# Patient Record
Sex: Female | Born: 1985 | Race: White | Hispanic: No | Smoking: Former smoker
Health system: Southern US, Community
[De-identification: ages and names within clinical notes are randomized; demographics above are authoritative.]

## PROBLEM LIST (undated history)

## (undated) DIAGNOSIS — R87629 Unspecified abnormal cytological findings in specimens from vagina: Secondary | ICD-10-CM

## (undated) DIAGNOSIS — S83006A Unspecified dislocation of unspecified patella, initial encounter: Secondary | ICD-10-CM

## (undated) DIAGNOSIS — F32A Depression, unspecified: Secondary | ICD-10-CM

## (undated) DIAGNOSIS — IMO0002 Reserved for concepts with insufficient information to code with codable children: Secondary | ICD-10-CM

## (undated) DIAGNOSIS — L0501 Pilonidal cyst with abscess: Secondary | ICD-10-CM

## (undated) DIAGNOSIS — R519 Headache, unspecified: Secondary | ICD-10-CM

## (undated) DIAGNOSIS — E162 Hypoglycemia, unspecified: Secondary | ICD-10-CM

## (undated) DIAGNOSIS — R87619 Unspecified abnormal cytological findings in specimens from cervix uteri: Secondary | ICD-10-CM

## (undated) DIAGNOSIS — A6000 Herpesviral infection of urogenital system, unspecified: Secondary | ICD-10-CM

## (undated) DIAGNOSIS — L709 Acne, unspecified: Secondary | ICD-10-CM

## (undated) DIAGNOSIS — S060X9A Concussion with loss of consciousness of unspecified duration, initial encounter: Secondary | ICD-10-CM

## (undated) DIAGNOSIS — A749 Chlamydial infection, unspecified: Secondary | ICD-10-CM

## (undated) DIAGNOSIS — F419 Anxiety disorder, unspecified: Secondary | ICD-10-CM

## (undated) DIAGNOSIS — Z8619 Personal history of other infectious and parasitic diseases: Secondary | ICD-10-CM

## (undated) HISTORY — DX: Chlamydial infection, unspecified: A74.9

## (undated) HISTORY — DX: Herpesviral infection of urogenital system, unspecified: A60.00

## (undated) HISTORY — DX: Hypoglycemia, unspecified: E16.2

## (undated) HISTORY — DX: Unspecified abnormal cytological findings in specimens from cervix uteri: R87.619

## (undated) HISTORY — DX: Reserved for concepts with insufficient information to code with codable children: IMO0002

## (undated) HISTORY — PX: MYRINGOTOMY WITH TUBE PLACEMENT: SHX5663

## (undated) HISTORY — DX: Concussion with loss of consciousness of unspecified duration, initial encounter: S06.0X9A

## (undated) HISTORY — DX: Unspecified dislocation of unspecified patella, initial encounter: S83.006A

## (undated) HISTORY — DX: Personal history of other infectious and parasitic diseases: Z86.19

## (undated) HISTORY — PX: TONSILLECTOMY AND ADENOIDECTOMY: SHX28

## (undated) HISTORY — DX: Acne, unspecified: L70.9

## (undated) HISTORY — DX: Pilonidal cyst with abscess: L05.01

---
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
## 2007-02-01 ENCOUNTER — Encounter: Payer: Self-pay | Admitting: Internal Medicine

## 2007-02-01 DIAGNOSIS — R42 Dizziness and giddiness: Secondary | ICD-10-CM

## 2007-02-17 ENCOUNTER — Ambulatory Visit: Payer: Self-pay | Admitting: Internal Medicine

## 2007-09-27 ENCOUNTER — Ambulatory Visit: Payer: Self-pay | Admitting: Internal Medicine

## 2007-09-27 DIAGNOSIS — N63 Unspecified lump in unspecified breast: Secondary | ICD-10-CM

## 2007-09-27 DIAGNOSIS — L708 Other acne: Secondary | ICD-10-CM | POA: Insufficient documentation

## 2007-10-09 ENCOUNTER — Telehealth: Payer: Self-pay | Admitting: Internal Medicine

## 2007-10-11 ENCOUNTER — Encounter: Payer: Self-pay | Admitting: Internal Medicine

## 2007-11-13 ENCOUNTER — Emergency Department (HOSPITAL_COMMUNITY): Admission: EM | Admit: 2007-11-13 | Discharge: 2007-11-13 | Payer: Self-pay | Admitting: Emergency Medicine

## 2007-11-22 ENCOUNTER — Ambulatory Visit: Payer: Self-pay | Admitting: Internal Medicine

## 2007-11-22 DIAGNOSIS — S99929A Unspecified injury of unspecified foot, initial encounter: Secondary | ICD-10-CM

## 2007-11-22 DIAGNOSIS — S060XAA Concussion with loss of consciousness status unknown, initial encounter: Secondary | ICD-10-CM | POA: Insufficient documentation

## 2007-11-22 DIAGNOSIS — S060X9A Concussion with loss of consciousness of unspecified duration, initial encounter: Secondary | ICD-10-CM

## 2007-11-22 DIAGNOSIS — S8990XA Unspecified injury of unspecified lower leg, initial encounter: Secondary | ICD-10-CM

## 2007-11-22 DIAGNOSIS — S99919A Unspecified injury of unspecified ankle, initial encounter: Secondary | ICD-10-CM

## 2007-11-22 HISTORY — DX: Concussion with loss of consciousness status unknown, initial encounter: S06.0XAA

## 2007-11-22 HISTORY — DX: Concussion with loss of consciousness of unspecified duration, initial encounter: S06.0X9A

## 2007-11-28 ENCOUNTER — Encounter: Payer: Self-pay | Admitting: Internal Medicine

## 2008-01-03 ENCOUNTER — Encounter: Payer: Self-pay | Admitting: Internal Medicine

## 2008-06-19 ENCOUNTER — Ambulatory Visit: Payer: Self-pay | Admitting: Internal Medicine

## 2008-06-19 DIAGNOSIS — F4321 Adjustment disorder with depressed mood: Secondary | ICD-10-CM

## 2008-06-19 DIAGNOSIS — R5381 Other malaise: Secondary | ICD-10-CM

## 2008-06-19 DIAGNOSIS — R5383 Other fatigue: Secondary | ICD-10-CM

## 2008-06-19 LAB — CONVERTED CEMR LAB
Bilirubin Urine: NEGATIVE
Glucose, Urine, Semiquant: NEGATIVE
Heterophile Ab Screen: NEGATIVE
Ketones, urine, test strip: NEGATIVE
Nitrite: NEGATIVE
Protein, U semiquant: NEGATIVE
Specific Gravity, Urine: 1.025
Urobilinogen, UA: 0.2
WBC Urine, dipstick: NEGATIVE
pH: 5.5

## 2008-07-15 ENCOUNTER — Encounter: Payer: Self-pay | Admitting: Internal Medicine

## 2008-07-16 ENCOUNTER — Encounter: Payer: Self-pay | Admitting: Internal Medicine

## 2008-07-16 LAB — CONVERTED CEMR LAB
ALT: 15 units/L (ref 0–35)
AST: 22 units/L (ref 0–37)
Albumin: 4.4 g/dL (ref 3.5–5.2)
Alkaline Phosphatase: 70 units/L (ref 39–117)
BUN: 14 mg/dL (ref 6–23)
Basophils Absolute: 0 10*3/uL (ref 0.0–0.1)
Basophils Relative: 0.5 % (ref 0.0–3.0)
Bilirubin, Direct: 0.1 mg/dL (ref 0.0–0.3)
CO2: 28 meq/L (ref 19–32)
Calcium: 10.1 mg/dL (ref 8.4–10.5)
Chloride: 106 meq/L (ref 96–112)
Cholesterol: 195 mg/dL (ref 0–200)
Creatinine, Ser: 0.7 mg/dL (ref 0.4–1.2)
Eosinophils Absolute: 0.2 10*3/uL (ref 0.0–0.7)
Eosinophils Relative: 4 % (ref 0.0–5.0)
Free T4: 0.9 ng/dL (ref 0.6–1.6)
GFR calc Af Amer: 137 mL/min
GFR calc non Af Amer: 113 mL/min
Glucose, Bld: 76 mg/dL (ref 70–99)
HCT: 45.3 % (ref 36.0–46.0)
HDL: 50.7 mg/dL (ref >39.0)
Hemoglobin: 15.7 g/dL — ABNORMAL HIGH (ref 12.0–15.0)
LDL Cholesterol: 124 mg/dL — ABNORMAL HIGH (ref 0–99)
Lymphocytes Relative: 26.4 % (ref 12.0–46.0)
MCHC: 34.5 g/dL (ref 30.0–36.0)
MCV: 95.3 fL (ref 78.0–100.0)
Monocytes Absolute: 0.3 10*3/uL (ref 0.1–1.0)
Monocytes Relative: 5.8 % (ref 3.0–12.0)
Neutro Abs: 3.5 10*3/uL (ref 1.4–7.7)
Neutrophils Relative %: 63.3 % (ref 43.0–77.0)
Platelets: 314 10*3/uL (ref 150–400)
Potassium: 4.1 meq/L (ref 3.5–5.1)
RBC: 4.76 M/uL (ref 3.87–5.11)
RDW: 12 % (ref 11.5–14.6)
Sodium: 141 meq/L (ref 135–145)
T3, Free: 3.3 pg/mL (ref 2.3–4.2)
TSH: 1.92 microintl units/mL (ref 0.35–5.50)
Total Bilirubin: 0.6 mg/dL (ref 0.3–1.2)
Total CHOL/HDL Ratio: 3.8
Total Protein: 7.8 g/dL (ref 6.0–8.3)
Triglycerides: 103 mg/dL (ref 0–149)
VLDL: 21 mg/dL (ref 0–40)
WBC: 5.5 10*3/uL (ref 4.5–10.5)

## 2008-07-23 ENCOUNTER — Ambulatory Visit: Payer: Self-pay | Admitting: Internal Medicine

## 2008-07-23 DIAGNOSIS — R82998 Other abnormal findings in urine: Secondary | ICD-10-CM

## 2008-07-23 LAB — CONVERTED CEMR LAB
Bilirubin Urine: NEGATIVE
Blood in Urine, dipstick: NEGATIVE
Glucose, Urine, Semiquant: NEGATIVE
Ketones, urine, test strip: NEGATIVE
Nitrite: NEGATIVE
Protein, U semiquant: NEGATIVE
Specific Gravity, Urine: 1.01
Urobilinogen, UA: 0.2
WBC Urine, dipstick: NEGATIVE
pH: 5

## 2008-12-18 ENCOUNTER — Other Ambulatory Visit: Admission: RE | Admit: 2008-12-18 | Discharge: 2008-12-18 | Payer: Self-pay | Admitting: Gynecology

## 2008-12-18 ENCOUNTER — Ambulatory Visit: Payer: Self-pay | Admitting: Gynecology

## 2008-12-18 ENCOUNTER — Encounter: Payer: Self-pay | Admitting: Gynecology

## 2009-01-22 ENCOUNTER — Ambulatory Visit: Payer: Self-pay | Admitting: Internal Medicine

## 2009-01-22 DIAGNOSIS — M25569 Pain in unspecified knee: Secondary | ICD-10-CM | POA: Insufficient documentation

## 2009-01-22 DIAGNOSIS — S83006A Unspecified dislocation of unspecified patella, initial encounter: Secondary | ICD-10-CM

## 2009-01-22 HISTORY — DX: Unspecified dislocation of unspecified patella, initial encounter: S83.006A

## 2009-07-29 ENCOUNTER — Ambulatory Visit: Payer: Self-pay | Admitting: Gynecology

## 2010-03-17 ENCOUNTER — Ambulatory Visit: Payer: Self-pay | Admitting: Internal Medicine

## 2010-03-17 DIAGNOSIS — R079 Chest pain, unspecified: Secondary | ICD-10-CM | POA: Insufficient documentation

## 2010-03-17 DIAGNOSIS — H6690 Otitis media, unspecified, unspecified ear: Secondary | ICD-10-CM | POA: Insufficient documentation

## 2010-03-17 DIAGNOSIS — J019 Acute sinusitis, unspecified: Secondary | ICD-10-CM | POA: Insufficient documentation

## 2010-03-26 DIAGNOSIS — IMO0002 Reserved for concepts with insufficient information to code with codable children: Secondary | ICD-10-CM

## 2010-03-26 HISTORY — DX: Reserved for concepts with insufficient information to code with codable children: IMO0002

## 2010-04-07 ENCOUNTER — Other Ambulatory Visit: Admission: RE | Admit: 2010-04-07 | Discharge: 2010-04-07 | Payer: Self-pay | Admitting: Gynecology

## 2010-04-07 ENCOUNTER — Ambulatory Visit: Payer: Self-pay | Admitting: Gynecology

## 2010-06-15 ENCOUNTER — Ambulatory Visit: Payer: Self-pay | Admitting: Gynecology
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
## 2010-09-09 ENCOUNTER — Ambulatory Visit (INDEPENDENT_AMBULATORY_CARE_PROVIDER_SITE_OTHER): Payer: BC Managed Care – PPO | Admitting: Gynecology

## 2010-09-09 DIAGNOSIS — N898 Other specified noninflammatory disorders of vagina: Secondary | ICD-10-CM

## 2010-09-09 DIAGNOSIS — B373 Candidiasis of vulva and vagina: Secondary | ICD-10-CM

## 2010-09-09 DIAGNOSIS — Z113 Encounter for screening for infections with a predominantly sexual mode of transmission: Secondary | ICD-10-CM

## 2010-10-27 ENCOUNTER — Encounter: Payer: Self-pay | Admitting: Internal Medicine

## 2010-10-27 ENCOUNTER — Ambulatory Visit (INDEPENDENT_AMBULATORY_CARE_PROVIDER_SITE_OTHER): Payer: BC Managed Care – PPO | Admitting: Internal Medicine

## 2010-10-27 VITALS — BP 100/60 | HR 72 | Temp 97.6°F | Wt 96.0 lb

## 2010-10-27 DIAGNOSIS — L0591 Pilonidal cyst without abscess: Secondary | ICD-10-CM

## 2010-10-27 MED ORDER — CEPHALEXIN 500 MG PO CAPS: 500.0000 mg | ORAL_CAPSULE | Freq: Three times a day (TID) | ORAL | Status: AC

## 2010-10-27 NOTE — Progress Notes (Signed)
  Subjective:    Patient ID: Melinda Bell, female    DOB: [birth year], 25 y.o.   MRN: [id number]  HPI Patient comes in today for an acute visit because she has a sore place at the base of her buttocks for 2 days. She has not had this problem before has had no injury it hurt very badly yesterday and is somewhat better today. There is no fever change in bowel habits or GU symptoms.   Review of Systems No fevers sweats history of skin infections.    Objective:   Physical Exam Well-developed well-nourished in no acute distress There is some redness at the base of the coccyx the pilonidal area but no cystic or mass area or fluctuance although the midline is somewhat red in and pooched out.  No central pore.  This is mildly tender.       Assessment & Plan:  Pilonidal cyst inflammation.  Though I don't feel a full abscess we discussed some I&D to see if we could express some pus patient was hesitant to do this.  Since she is better than she was yesterday we can try conservative therapy with warm compresses and antibiotic and followup in 24-48 hours if getting worse. She is leaving town for the yeast or holiday tomorrow sometime and she can call in the morning and we can work her in  For above if needed.

## 2010-10-27 NOTE — Patient Instructions (Signed)
This appears to be a pilonial cyst infection  It is very small but could be come large and need to be drained for comfort and to get better .  Warm compresses( showers) 5-10 minutes  frequently   Antibiotic  As directed  Can call in am and we can work you in  If needed if worse instead of better .
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
## 2011-03-27 DIAGNOSIS — A749 Chlamydial infection, unspecified: Secondary | ICD-10-CM

## 2011-03-27 HISTORY — DX: Chlamydial infection, unspecified: A74.9

## 2011-04-10 ENCOUNTER — Other Ambulatory Visit: Payer: Self-pay | Admitting: Gynecology

## 2011-04-12 ENCOUNTER — Other Ambulatory Visit: Payer: Self-pay | Admitting: *Deleted

## 2011-04-12 DIAGNOSIS — IMO0001 Reserved for inherently not codable concepts without codable children: Secondary | ICD-10-CM

## 2011-04-12 MED ORDER — NORGESTIMATE-ETH ESTRADIOL 0.25-35 MG-MCG PO TABS: 1.0000 | ORAL_TABLET | Freq: Every day | ORAL | Status: DC

## 2011-04-15 DIAGNOSIS — IMO0002 Reserved for concepts with insufficient information to code with codable children: Secondary | ICD-10-CM | POA: Insufficient documentation

## 2011-04-21 ENCOUNTER — Other Ambulatory Visit (HOSPITAL_COMMUNITY)
Admission: RE | Admit: 2011-04-21 | Discharge: 2011-04-21 | Disposition: A | Discharge status: Home / Self Care | Payer: BC Managed Care – PPO | Source: Ambulatory Visit | Attending: Gynecology | Admitting: Gynecology

## 2011-04-21 ENCOUNTER — Encounter: Payer: Self-pay | Admitting: Gynecology

## 2011-04-21 ENCOUNTER — Ambulatory Visit (INDEPENDENT_AMBULATORY_CARE_PROVIDER_SITE_OTHER): Payer: BC Managed Care – PPO | Admitting: Gynecology

## 2011-04-21 DIAGNOSIS — B373 Candidiasis of vulva and vagina: Secondary | ICD-10-CM

## 2011-04-21 DIAGNOSIS — R82998 Other abnormal findings in urine: Secondary | ICD-10-CM

## 2011-04-21 DIAGNOSIS — N870 Mild cervical dysplasia: Secondary | ICD-10-CM

## 2011-04-21 DIAGNOSIS — Z131 Encounter for screening for diabetes mellitus: Secondary | ICD-10-CM

## 2011-04-21 DIAGNOSIS — R3915 Urgency of urination: Secondary | ICD-10-CM

## 2011-04-21 DIAGNOSIS — N390 Urinary tract infection, site not specified: Secondary | ICD-10-CM

## 2011-04-21 DIAGNOSIS — Z3041 Encounter for surveillance of contraceptive pills: Secondary | ICD-10-CM

## 2011-04-21 DIAGNOSIS — Z13220 Encounter for screening for lipoid disorders: Secondary | ICD-10-CM

## 2011-04-21 DIAGNOSIS — N898 Other specified noninflammatory disorders of vagina: Secondary | ICD-10-CM

## 2011-04-21 DIAGNOSIS — Z01419 Encounter for gynecological examination (general) (routine) without abnormal findings: Secondary | ICD-10-CM | POA: Insufficient documentation

## 2011-04-21 DIAGNOSIS — Z113 Encounter for screening for infections with a predominantly sexual mode of transmission: Secondary | ICD-10-CM

## 2011-04-21 MED ORDER — TERCONAZOLE 0.8 % VA CREA: 1.0000 | TOPICAL_CREAM | Freq: Every day | VAGINAL | Status: AC

## 2011-04-21 MED ORDER — SULFAMETHOXAZOLE-TRIMETHOPRIM 800-160 MG PO TABS: 1.0000 | ORAL_TABLET | Freq: Two times a day (BID) | ORAL | Status: AC

## 2011-04-21 MED ORDER — NORGESTIMATE-ETH ESTRADIOL 0.25-35 MG-MCG PO TABS: 1.0000 | ORAL_TABLET | Freq: Every day | ORAL | Status: DC

## 2011-04-21 NOTE — Progress Notes (Signed)
Melinda Bell Jan 16, 1986 [id number]        25 y.o.  for annual exam.  Overall she's doing well but she does note the last several days an increased frequency and suprapubic pressure some mild dysuria. Also a vaginal discharge not really itchy but just heavier. She does have a history of recurrent yeast in the past. She has a history of low-grade SIL changes colposcopic biopsy confirmed low-grade changes a year ago she did not followup for her six-month Pap as recommended. She is on Sprintec doing well wants to continue.     Past medical history,surgical history, medications, allergies, family history and social history were all reviewed and documented in the EPIC chart. ROS:  Was performed and pertinent positives and negatives are included in the history.  Exam: chaperone present Filed Vitals:   04/21/11 1207  BP: 106/60   General appearance  Normal Skin grossly normal Head/Neck normal with no cervical or supraclavicular adenopathy thyroid normal Lungs  clear Cardiac RR, without RMG Abdominal  soft, nontender, without masses, organomegaly or hernia Breasts  examined lying and sitting without masses, retractions, discharge or axillary adenopathy. Pelvic  Ext/BUS/vagina  normal except white discharge, KOH. Wet prep  done  Cervix  normal GC Chlamydia screen and Pap smear done  Uterus  retroverted, normal size, shape and contour, midline and mobile nontender   Adnexa  Without masses or tenderness    Anus and perineum  normal     Assessment/Plan:  25 y.o. female for annual exam.    1. White discharge. K. OH wet prep is positive for yeast we'll treat with Terazol 3 date cream follow up if symptoms persist or recur. 2. UTI. Symptoms and urinalysis consistent with UTI will treat with Septra DS 1 by mouth twice a day x3 days follow up if symptoms persist or recur. 3. Low-grade SIL. Pap done today if normal or low-grade repeat in 6 months I stressed the need for 6 months versus a year she  agrees with this. 4. Breast lump. On review of her problem list, breast lump was noted by Dr. Laury Axon. I questioned the patient she said she had a lump on her right breast 12:00 above her nipple that they had done ultrasounds and said it looked just like normal tissue several years ago and to follow the area. On exam today she has normal fibroglandular tissue palpable throughout the breasts I feel no masses or other abnormalities.  I encouraged her to followup with self breast exams monthly as long as this hasn't changed her exam we'll follow this. 5. Health maintenance. Self breast exams on a monthly basis discussed urged with her as above. Pap smear followup as above. Discussed and offered Gardasil series gave her literature she will think it over will followup if she chooses to do so. GC Chlamydia screen done at her request. I refilled her Sprintec times a year she's doing well wants to continue. Baseline CBC glucose lipid profile urinalysis and culture were ordered. Assuming she continues well she'll see me in 6 months sooner as needed.    Dara Lords MD, 12:46 PM 04/21/2011

## 2011-04-22 ENCOUNTER — Other Ambulatory Visit: Payer: Self-pay | Admitting: Gynecology

## 2011-04-22 ENCOUNTER — Other Ambulatory Visit (INDEPENDENT_AMBULATORY_CARE_PROVIDER_SITE_OTHER): Payer: BC Managed Care – PPO | Admitting: *Deleted

## 2011-04-22 ENCOUNTER — Encounter: Payer: Self-pay | Admitting: Gynecology

## 2011-04-22 DIAGNOSIS — Z01419 Encounter for gynecological examination (general) (routine) without abnormal findings: Secondary | ICD-10-CM

## 2011-04-22 DIAGNOSIS — Z131 Encounter for screening for diabetes mellitus: Secondary | ICD-10-CM

## 2011-04-22 DIAGNOSIS — Z13220 Encounter for screening for lipoid disorders: Secondary | ICD-10-CM

## 2011-04-22 DIAGNOSIS — A749 Chlamydial infection, unspecified: Secondary | ICD-10-CM

## 2011-04-22 MED ORDER — AZITHROMYCIN 1 G PO PACK: 1.0000 | PACK | Freq: Once | ORAL | Status: AC

## 2011-05-27 DIAGNOSIS — A6000 Herpesviral infection of urogenital system, unspecified: Secondary | ICD-10-CM

## 2011-05-27 HISTORY — DX: Herpesviral infection of urogenital system, unspecified: A60.00

## 2011-06-07 ENCOUNTER — Encounter: Payer: Self-pay | Admitting: Gynecology

## 2011-06-07 ENCOUNTER — Ambulatory Visit (INDEPENDENT_AMBULATORY_CARE_PROVIDER_SITE_OTHER): Payer: BC Managed Care – PPO | Admitting: Gynecology

## 2011-06-07 DIAGNOSIS — B373 Candidiasis of vulva and vagina: Secondary | ICD-10-CM

## 2011-06-07 DIAGNOSIS — A6000 Herpesviral infection of urogenital system, unspecified: Secondary | ICD-10-CM

## 2011-06-07 DIAGNOSIS — L293 Anogenital pruritus, unspecified: Secondary | ICD-10-CM

## 2011-06-07 DIAGNOSIS — N898 Other specified noninflammatory disorders of vagina: Secondary | ICD-10-CM

## 2011-06-07 DIAGNOSIS — B3731 Acute candidiasis of vulva and vagina: Secondary | ICD-10-CM

## 2011-06-07 DIAGNOSIS — N766 Ulceration of vulva: Secondary | ICD-10-CM

## 2011-06-07 MED ORDER — VALACYCLOVIR HCL 500 MG PO TABS: 500.0000 mg | ORAL_TABLET | Freq: Every day | ORAL | Status: DC

## 2011-06-07 MED ORDER — FLUCONAZOLE 150 MG PO TABS: 150.0000 mg | ORAL_TABLET | Freq: Once | ORAL | Status: AC

## 2011-06-07 MED ORDER — LIDOCAINE HCL 2 % EX GEL: CUTANEOUS | Status: DC | PRN

## 2011-06-07 MED ORDER — VALACYCLOVIR HCL 1 G PO TABS: 1000.0000 mg | ORAL_TABLET | Freq: Two times a day (BID) | ORAL | Status: AC

## 2011-06-07 NOTE — Progress Notes (Signed)
Patient presents having recently been treated for asymptomatic Chlamydia screening positivity. She now has several days worth of vulvar ulcer. No fevers chills or other constitutional symptoms.  Exam Abdomen soft nontender without masses guarding rebound organomegaly Groin with left inguinal adenopathy tender External with small punctate ulcer clitoral hood, small punctate ulcer upper-inner left labia minora extremely tender to manipulation BUS vagina with thick white discharge KOH wet prep done Cervix normal Uterus normal size midline mobile nontender Adnexa without masses or tenderness  Assessment and plan: 1. White discharge positive for yeast. We'll treat with Diflucan 150x1 dose. 2. 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 I did agency PCR of the inner labial lesion I discussed with the patient that is no question in my mind if she has HSV-2. We'll start with Valtrex 1000 mg twice a day x10 days, sitz baths, lidocaine 2% jelly per external application. The issues of suppressive dose with Valtrex 500 daily both to suppress recurrences as well as transmission partners was discussed. Options for expectant management to see what her track record is from an outbreak standpoint versus treatment was reviewed and she wants to go ahead and start suppressive treatment. I prescribed Valtrex 500 mg daily refill x1 year to start following the 1000 mg twice a day dosage. To follow up for the HSV PCR. 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

## 2011-09-09 ENCOUNTER — Encounter: Payer: Self-pay | Admitting: Family

## 2011-09-09 ENCOUNTER — Ambulatory Visit (INDEPENDENT_AMBULATORY_CARE_PROVIDER_SITE_OTHER): Payer: BC Managed Care – PPO | Admitting: Family

## 2011-09-09 VITALS — BP 110/80 | Temp 98.6°F | Wt 106.0 lb

## 2011-09-09 DIAGNOSIS — J020 Streptococcal pharyngitis: Secondary | ICD-10-CM

## 2011-09-09 DIAGNOSIS — J029 Acute pharyngitis, unspecified: Secondary | ICD-10-CM

## 2011-09-09 MED ORDER — AZITHROMYCIN 250 MG PO TABS: 250.0000 mg | ORAL_TABLET | Freq: Every day | ORAL | Status: AC
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
## 2011-09-09 NOTE — Patient Instructions (Signed)
Strep Throat     Strep throat is an infection of the throat caused by a bacteria named Streptococcus pyogenes. Your caregiver may call the infection streptococcal "tonsillitis" or "pharyngitis" depending on whether there are signs of inflammation in the tonsils or back of the throat. Strep throat is most common in children from 5 to 24 years old during the cold months of the year, but it can occur in people of any age during any season. This infection is spread from person to person (contagious) through coughing, sneezing, or other close contact.  SYMPTOMS   · Fever or chills.   · Painful, swollen, red tonsils or throat.   · Pain or difficulty when swallowing.   · White or yellow spots on the tonsils or throat.   · Swollen, tender lymph nodes or "glands" of the neck or under the jaw.   · Red rash all over the body (rare).   DIAGNOSIS   Many different infections can cause the same symptoms. A test must be done to confirm the diagnosis so the right treatment can be given. A "rapid strep test" can help your caregiver make the diagnosis in a few minutes. If this test is not available, a light swab of the infected area can be used for a throat culture test. If a throat culture test is done, results are usually available in a day or two.  TREATMENT   Strep throat is treated with antibiotic medicine.  HOME CARE INSTRUCTIONS   · Gargle with 1 tsp of salt in 1 cup of warm water, 3 to 4 times per day or as needed for comfort.   · Family members who also have a sore throat or fever should be tested for strep throat and treated with antibiotics if they have the strep infection.   · Make sure everyone in your household washes their hands well.   · Do not share food, drinking cups, or personal items that could cause the infection to spread to others.   · You may need to eat a soft food diet until your sore throat gets better.   · Drink enough water and fluids to keep your urine clear or pale yellow. This will help prevent  dehydration.   · Get plenty of rest.   · Stay home from school, daycare, or work until you have been on antibiotics for 24 hours.   · Only take over-the-counter or prescription medicines for pain, discomfort, or fever as directed by your caregiver.   · If antibiotics are prescribed, take them as directed. Finish them even if you start to feel better.   SEEK MEDICAL CARE IF:   · The glands in your neck continue to enlarge.   · You develop a rash, cough, or earache.   · You cough up green, yellow-brown, or bloody sputum.   · You have pain or discomfort not controlled by medicines.   · Your problems seem to be getting worse rather than better.   SEEK IMMEDIATE MEDICAL CARE IF:   · You develop any new symptoms such as vomiting, severe headache, stiff or painful neck, chest pain, shortness of breath, or trouble swallowing.   · You develop severe throat pain, drooling, or changes in your voice.   · You develop swelling of the neck, or the skin on the neck becomes red and tender.   · You have a fever.   · You develop signs of dehydration, such as fatigue, dry mouth, and decreased urination.   · 

## 2011-10-26 ENCOUNTER — Other Ambulatory Visit (HOSPITAL_COMMUNITY)
Admission: RE | Admit: 2011-10-26 | Discharge: 2011-10-26 | Disposition: A | Discharge status: Home / Self Care | Payer: BC Managed Care – PPO | Source: Ambulatory Visit | Attending: Gynecology | Admitting: Gynecology

## 2011-10-26 ENCOUNTER — Ambulatory Visit (INDEPENDENT_AMBULATORY_CARE_PROVIDER_SITE_OTHER): Payer: BC Managed Care – PPO | Admitting: Gynecology

## 2011-10-26 ENCOUNTER — Encounter: Payer: Self-pay | Admitting: Gynecology

## 2011-10-26 DIAGNOSIS — Z01419 Encounter for gynecological examination (general) (routine) without abnormal findings: Secondary | ICD-10-CM | POA: Insufficient documentation

## 2011-10-26 DIAGNOSIS — N870 Mild cervical dysplasia: Secondary | ICD-10-CM

## 2011-10-26 DIAGNOSIS — N912 Amenorrhea, unspecified: Secondary | ICD-10-CM

## 2011-10-26 NOTE — Patient Instructions (Signed)
Make appointment to see an obstetrical group in town within the next 4 weeks. Continue on a prenatal vitamin.

## 2011-10-26 NOTE — Progress Notes (Signed)
Patient presents with 2 issues. 1. Late by several days for her menses with a positive home pregnancy test. 2. History of low-grade SIL 03/2010 for 6 month follow up Pap smear. Her last Pap smear in September 2012 was normal.  Exam with Selena Batten chaperone present Pelvic external BUS vagina normal. Cervix normal Pap done. Uterus retroverted soft normal size midline mobile nontender. Adnexa without masses or tenderness.  Assessment and plan: 1. Late for menses. UPT is positive. She is approximately 4-5 weeks. She understands we do not do obstetrics and I gave her the name of several obstetrical groups in town and she will call and make an appointment sometime over the next several weeks. She is on Valtrex and I have asked her to stop this for now and make sure that the other group knows she does have a history of HSV and they'll discuss this issue with her. She is on multivitamin with folic acid and will continue on a OTC prenatal vitamin. 2. History of low-grade SIL. Pap done today. Assuming normal or low-grade then she'll follow up postpartum. If high-grade then will triage based on results.

## 2011-11-15 ENCOUNTER — Other Ambulatory Visit: Payer: Self-pay

## 2011-11-15 ENCOUNTER — Other Ambulatory Visit: Payer: Self-pay | Admitting: Obstetrics and Gynecology

## 2011-11-15 LAB — OB RESULTS CONSOLE ABO/RH: RH Type: POSITIVE

## 2011-11-15 LAB — OB RESULTS CONSOLE RPR: RPR: NONREACTIVE

## 2011-11-15 LAB — OB RESULTS CONSOLE RUBELLA ANTIBODY, IGM: Rubella: IMMUNE

## 2011-11-15 LAB — OB RESULTS CONSOLE HIV ANTIBODY (ROUTINE TESTING): HIV: NONREACTIVE

## 2012-03-01 ENCOUNTER — Other Ambulatory Visit (HOSPITAL_COMMUNITY): Payer: Self-pay | Admitting: Obstetrics and Gynecology

## 2012-03-01 DIAGNOSIS — IMO0001 Reserved for inherently not codable concepts without codable children: Secondary | ICD-10-CM

## 2012-03-08 ENCOUNTER — Ambulatory Visit (HOSPITAL_COMMUNITY)
Admission: RE | Admit: 2012-03-08 | Discharge: 2012-03-08 | Disposition: A | Discharge status: Home / Self Care | Payer: BC Managed Care – PPO | Source: Ambulatory Visit | Attending: Obstetrics and Gynecology | Admitting: Obstetrics and Gynecology

## 2012-03-08 ENCOUNTER — Encounter (HOSPITAL_COMMUNITY): Payer: Self-pay

## 2012-03-08 VITALS — BP 106/66 | HR 89 | Wt 118.0 lb

## 2012-03-08 DIAGNOSIS — Z363 Encounter for antenatal screening for malformations: Secondary | ICD-10-CM | POA: Insufficient documentation

## 2012-03-08 DIAGNOSIS — O98519 Other viral diseases complicating pregnancy, unspecified trimester: Secondary | ICD-10-CM | POA: Insufficient documentation

## 2012-03-08 DIAGNOSIS — IMO0001 Reserved for inherently not codable concepts without codable children: Secondary | ICD-10-CM

## 2012-03-08 DIAGNOSIS — Z1389 Encounter for screening for other disorder: Secondary | ICD-10-CM | POA: Insufficient documentation

## 2012-03-08 DIAGNOSIS — O30009 Twin pregnancy, unspecified number of placenta and unspecified number of amniotic sacs, unspecified trimester: Secondary | ICD-10-CM | POA: Insufficient documentation

## 2012-03-08 DIAGNOSIS — O358XX0 Maternal care for other (suspected) fetal abnormality and damage, not applicable or unspecified: Secondary | ICD-10-CM | POA: Insufficient documentation

## 2012-03-08 DIAGNOSIS — A6000 Herpesviral infection of urogenital system, unspecified: Secondary | ICD-10-CM | POA: Insufficient documentation

## 2012-03-09 NOTE — Progress Notes (Signed)
Melinda Bell was seen for ultrasound appointment today.  Please see [facility] report for details.  

## 2012-03-10 ENCOUNTER — Emergency Department (HOSPITAL_COMMUNITY)
Admission: EM | Admit: 2012-03-10 | Discharge: 2012-03-11 | Disposition: A | Discharge status: Home / Self Care | Payer: BC Managed Care – PPO | Attending: Emergency Medicine | Admitting: Emergency Medicine

## 2012-03-10 DIAGNOSIS — L0501 Pilonidal cyst with abscess: Secondary | ICD-10-CM | POA: Insufficient documentation

## 2012-03-10 DIAGNOSIS — O2690 Pregnancy related conditions, unspecified, unspecified trimester: Secondary | ICD-10-CM | POA: Insufficient documentation

## 2012-03-11 ENCOUNTER — Encounter (HOSPITAL_COMMUNITY): Payer: Self-pay | Admitting: Emergency Medicine

## 2012-03-11 MED ORDER — LIDOCAINE-EPINEPHRINE 2 %-1:100000 IJ SOLN: 20.0000 mL | Freq: Once | INTRAMUSCULAR | Status: DC

## 2012-03-11 NOTE — ED Notes (Signed)
Patient reports an abscess type infection to her buttocks x 1 week. Patient is 5 months pregnant with twins
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
## 2012-03-13 ENCOUNTER — Telehealth: Payer: Self-pay | Admitting: Internal Medicine

## 2012-03-13 NOTE — Telephone Encounter (Signed)
Spoke to the pt.  She needs to have her packing removed from cyst.  Scheduled for 03/14/12 @ 11:15.  Instructed by hospital to have removed today.  No available doctors.  She reports no pain, fever, foul smells but has noticed some yellow draining.

## 2012-03-13 NOTE — Telephone Encounter (Signed)
It sounds like this could wait until tomorrow

## 2012-03-13 NOTE — Telephone Encounter (Signed)
Please advise if anything further needs to be done.

## 2012-03-13 NOTE — Telephone Encounter (Signed)
Caller: Azarie/Patient; Patient Name: Melinda Bell; PCP: Madelin Headings.; Best Callback Phone Number: [phone].  Call regarding follow-up from Cyst I&D and packing performed in Emergency Room on 8-16. Wound at top of buttocks.  Patient was advised to follow up with PCP to see if wound needs re-packing.  Patient is 5 1/2 months pregnant, no antibiotics given, normal fetal movement, denies Abdominal pain and no bleeding.  Afebrile.  Wound has redness spreading, Afebrile. Patient advised to have packing removed with-in 48 hours.  All emergent symptoms ruled out per Postop Wound Care Protocol.  Patient unable to make appointment at 10:15 with Dr Amador Cunas due to travel time.  PLEASE FOLLOW UP WITH PATIENT IF AVAILABLE TO FIT IN DUE TO MD'S ADVISE TO HAVE PACKING REMOVED ON 8-19.

## 2012-03-14 ENCOUNTER — Encounter: Payer: Self-pay | Admitting: Internal Medicine

## 2012-03-14 ENCOUNTER — Ambulatory Visit (INDEPENDENT_AMBULATORY_CARE_PROVIDER_SITE_OTHER): Admitting: Internal Medicine

## 2012-03-14 VITALS — BP 92/50 | HR 108 | Temp 98.3°F | Wt 117.0 lb

## 2012-03-14 DIAGNOSIS — Z4800 Encounter for change or removal of nonsurgical wound dressing: Secondary | ICD-10-CM

## 2012-03-14 DIAGNOSIS — D239 Other benign neoplasm of skin, unspecified: Secondary | ICD-10-CM

## 2012-03-14 DIAGNOSIS — D229 Melanocytic nevi, unspecified: Secondary | ICD-10-CM | POA: Insufficient documentation

## 2012-03-14 DIAGNOSIS — L0501 Pilonidal cyst with abscess: Secondary | ICD-10-CM

## 2012-03-14 HISTORY — DX: Pilonidal cyst with abscess: L05.01

## 2012-03-14 NOTE — Patient Instructions (Signed)
Local care  Hot showers and compresses and recheck as needed. Have derm dr Danella Deis check mole.  Before delivery.

## 2012-03-14 NOTE — Progress Notes (Signed)
  Subjective:    Patient ID: Melinda Bell, female    DOB: [birth year], 26 y.o.   MRN: [id number]  HPI Patient comes in today with mother for followup from an emergency room visit where she presented 4 days ago with a pilonidal cyst abscess. It was I&D and packing was placed. She was put on no medication. She comes in today for packing removal and wound check. Pain is much improved although there is still some firmness around the area she's doing warm compresses some drainage.  She is [redacted] weeks pregnant twins pregnancy. Husband is in [place] assigned for 2 years comes home for delivery she is no longer working outside the home until delivery.  Past history family history social history reviewed in the electronic medical record.  Review of Systems Outpatient Encounter Prescriptions as of 03/14/2012  Medication Sig Dispense Refill  . Prenatal Vit-Fe Fumarate-FA (PRENATAL MULTIVITAMIN) TABS Take 1 tablet by mouth daily.      . valACYclovir (VALTREX) 500 MG tablet Take 500 mg by mouth daily.       negative for fever chest pain shortness of breath. No complication with pregnancy EDC 1213     Objective:   Physical Exam BP 92/50  Pulse 108  Temp 98.3 F (36.8 C) (Oral)  Wt 117 lb (53.071 kg)  SpO2 98%  LMP 10/01/2011 Well-developed well-nourished in no acute distress mall 3 mm I&D incision with packing in place about 4-5 cm  Removed without problem minimal amount of drainage expressed  slight induration left inferior but no abscess or fluctuance noted patient tolerated well    skin on right buttocks there is an irregular flat. Dark mole about 4-5 mm patient states that she's had that a long time. Doesn't think it's changed     Assessment & Plan:  Pilonidal cyst abscess status post I&D packing removed today use hot compresses warm showers may continue to drain contact us if recurring discussed that sometimes recurring problems will need a surgical consult but do not see any alarm features  today.  Mole right buttocks  looks irregular and dark although patient states it's always been there. Dr. Danella Deis has seen her in the past for acne advised she could have appointments and have her check this area before delivery.

## 2012-03-22 ENCOUNTER — Other Ambulatory Visit (HOSPITAL_COMMUNITY): Payer: Self-pay | Admitting: Maternal and Fetal Medicine

## 2012-03-22 ENCOUNTER — Ambulatory Visit (HOSPITAL_COMMUNITY)
Admission: RE | Admit: 2012-03-22 | Discharge: 2012-03-22 | Disposition: A | Discharge status: Home / Self Care | Payer: BC Managed Care – PPO | Source: Ambulatory Visit | Attending: Obstetrics and Gynecology | Admitting: Obstetrics and Gynecology

## 2012-03-22 VITALS — BP 103/56 | HR 80 | Wt 119.5 lb

## 2012-03-22 DIAGNOSIS — O30009 Twin pregnancy, unspecified number of placenta and unspecified number of amniotic sacs, unspecified trimester: Secondary | ICD-10-CM | POA: Insufficient documentation

## 2012-03-22 DIAGNOSIS — IMO0001 Reserved for inherently not codable concepts without codable children: Secondary | ICD-10-CM

## 2012-03-22 DIAGNOSIS — O30039 Twin pregnancy, monochorionic/diamniotic, unspecified trimester: Secondary | ICD-10-CM | POA: Insufficient documentation

## 2012-03-22 DIAGNOSIS — Z3689 Encounter for other specified antenatal screening: Secondary | ICD-10-CM | POA: Insufficient documentation

## 2012-03-22 NOTE — Progress Notes (Signed)
Melinda Bell was seen for ultrasound appointment today.  Please see [facility] report for details.  

## 2012-04-05 ENCOUNTER — Ambulatory Visit (HOSPITAL_COMMUNITY)
Admission: RE | Admit: 2012-04-05 | Discharge: 2012-04-05 | Disposition: A | Discharge status: Home / Self Care | Payer: BC Managed Care – PPO | Source: Ambulatory Visit | Attending: Obstetrics and Gynecology | Admitting: Obstetrics and Gynecology

## 2012-04-05 ENCOUNTER — Other Ambulatory Visit (HOSPITAL_COMMUNITY): Payer: Self-pay | Admitting: Maternal and Fetal Medicine

## 2012-04-05 VITALS — BP 106/56 | HR 89 | Wt 123.0 lb

## 2012-04-05 DIAGNOSIS — O30039 Twin pregnancy, monochorionic/diamniotic, unspecified trimester: Secondary | ICD-10-CM | POA: Insufficient documentation

## 2012-04-05 DIAGNOSIS — IMO0001 Reserved for inherently not codable concepts without codable children: Secondary | ICD-10-CM

## 2012-04-05 DIAGNOSIS — O30009 Twin pregnancy, unspecified number of placenta and unspecified number of amniotic sacs, unspecified trimester: Secondary | ICD-10-CM | POA: Insufficient documentation

## 2012-04-05 DIAGNOSIS — Z3689 Encounter for other specified antenatal screening: Secondary | ICD-10-CM | POA: Insufficient documentation

## 2012-04-05 NOTE — Progress Notes (Signed)
Ms. Muench was seen for ultrasound appointment today.  Please see [facility]-OBGYN report for details.

## 2012-04-19 ENCOUNTER — Encounter (HOSPITAL_COMMUNITY): Payer: Self-pay

## 2012-04-19 ENCOUNTER — Ambulatory Visit (HOSPITAL_COMMUNITY)
Admission: RE | Admit: 2012-04-19 | Discharge: 2012-04-19 | Disposition: A | Discharge status: Home / Self Care | Payer: BC Managed Care – PPO | Source: Ambulatory Visit | Attending: Obstetrics and Gynecology | Admitting: Obstetrics and Gynecology

## 2012-04-19 VITALS — BP 101/53 | HR 73

## 2012-04-19 DIAGNOSIS — A6000 Herpesviral infection of urogenital system, unspecified: Secondary | ICD-10-CM | POA: Insufficient documentation

## 2012-04-19 DIAGNOSIS — O98519 Other viral diseases complicating pregnancy, unspecified trimester: Secondary | ICD-10-CM | POA: Insufficient documentation

## 2012-04-19 DIAGNOSIS — IMO0001 Reserved for inherently not codable concepts without codable children: Secondary | ICD-10-CM

## 2012-04-19 DIAGNOSIS — O30009 Twin pregnancy, unspecified number of placenta and unspecified number of amniotic sacs, unspecified trimester: Secondary | ICD-10-CM | POA: Insufficient documentation

## 2012-04-19 NOTE — Progress Notes (Signed)
Melinda Bell was seen for ultrasound appointment today.  Please see [facility] report for details.  

## 2012-04-28 ENCOUNTER — Other Ambulatory Visit (HOSPITAL_COMMUNITY): Payer: Self-pay | Admitting: Obstetrics and Gynecology

## 2012-04-28 ENCOUNTER — Other Ambulatory Visit: Payer: Self-pay | Admitting: Internal Medicine

## 2012-04-28 DIAGNOSIS — IMO0001 Reserved for inherently not codable concepts without codable children: Secondary | ICD-10-CM

## 2012-05-03 ENCOUNTER — Ambulatory Visit (HOSPITAL_COMMUNITY)
Admission: RE | Admit: 2012-05-03 | Discharge: 2012-05-03 | Disposition: A | Discharge status: Home / Self Care | Payer: BC Managed Care – PPO | Source: Ambulatory Visit | Attending: Obstetrics and Gynecology | Admitting: Obstetrics and Gynecology

## 2012-05-03 ENCOUNTER — Inpatient Hospital Stay (HOSPITAL_COMMUNITY): Admission: RE | Admit: 2012-05-03 | Source: Ambulatory Visit

## 2012-05-03 ENCOUNTER — Other Ambulatory Visit (HOSPITAL_COMMUNITY): Payer: Self-pay | Admitting: Obstetrics and Gynecology

## 2012-05-03 VITALS — BP 99/59 | HR 77 | Wt 129.0 lb

## 2012-05-03 DIAGNOSIS — O30009 Twin pregnancy, unspecified number of placenta and unspecified number of amniotic sacs, unspecified trimester: Secondary | ICD-10-CM | POA: Insufficient documentation

## 2012-05-03 DIAGNOSIS — O98519 Other viral diseases complicating pregnancy, unspecified trimester: Secondary | ICD-10-CM | POA: Insufficient documentation

## 2012-05-03 DIAGNOSIS — A6000 Herpesviral infection of urogenital system, unspecified: Secondary | ICD-10-CM | POA: Insufficient documentation

## 2012-05-03 DIAGNOSIS — IMO0001 Reserved for inherently not codable concepts without codable children: Secondary | ICD-10-CM

## 2012-05-03 NOTE — Progress Notes (Signed)
Melinda Bell  was seen today for an ultrasound appointment.  See full report in [facility]-OB/GYN.  Alpha Gula, MD  MC/DA twin gestation with best dates of 30 5/7 weeks Interval growth is concordant.  Twin A: Maternal right, cephalic, anterior placenta.   Interval growth is appropriate (51st %tile)  Normal amniotic fluid volume  Twin B: Maternal left, cephalic, anterior placenta.  Interval growth is appropriate (57th %tile) Normal amniotic fluid volume  Recommend follow-up ultrasound examination in 2 weeks for  twin-twin transfusion surveillance and interval growth scan in 4 weeks.

## 2012-05-17 ENCOUNTER — Ambulatory Visit (HOSPITAL_COMMUNITY)
Admission: RE | Admit: 2012-05-17 | Discharge: 2012-05-17 | Disposition: A | Discharge status: Home / Self Care | Payer: BC Managed Care – PPO | Source: Ambulatory Visit | Attending: Obstetrics and Gynecology | Admitting: Obstetrics and Gynecology

## 2012-05-17 DIAGNOSIS — A6000 Herpesviral infection of urogenital system, unspecified: Secondary | ICD-10-CM | POA: Insufficient documentation

## 2012-05-17 DIAGNOSIS — IMO0001 Reserved for inherently not codable concepts without codable children: Secondary | ICD-10-CM

## 2012-05-17 DIAGNOSIS — O98519 Other viral diseases complicating pregnancy, unspecified trimester: Secondary | ICD-10-CM | POA: Insufficient documentation

## 2012-05-17 DIAGNOSIS — O30009 Twin pregnancy, unspecified number of placenta and unspecified number of amniotic sacs, unspecified trimester: Secondary | ICD-10-CM | POA: Insufficient documentation

## 2012-05-17 NOTE — Progress Notes (Signed)
Melinda Bell  was seen today for an ultrasound appointment.  See full report in [facility]-OB/GYN.  Alpha Gula, MD  Monochorionic diamniotic intrauterine pregnancy at 32 weeks 5 days. Normal amniotic fluid volume x2 Bladders visible in both fetuses No evidence of twin-twin transfusion.  Recommend follow up ultrasound in 2 weeks for interval growth.

## 2012-05-23 ENCOUNTER — Telehealth: Payer: Self-pay | Admitting: Internal Medicine

## 2012-05-23 NOTE — Telephone Encounter (Signed)
Caller: [name]; Patient Name: Melinda Bell; PCP: Berniece Andreas [facility]); Best Callback Phone Number: [phone].  Patient calling to inquire what she needs to do to have Dr. Fabian Sharp be her babies' MD.  States she is a current patient of Dr. Fabian Sharp; she is expecting twins and is "due any day."  TC to office; advised to notify [facility] staff that Melinda Bell will be the babies' doctor, and while the hospital staff MD's will care for the babies while in the hospital, as soon as she knows date of birth and names etc, call the office and schedule newborn weight checks for a day or so after discharge.  No further questions or concerns.

## 2012-05-29 LAB — OB RESULTS CONSOLE GBS: GBS: NEGATIVE

## 2012-05-30 ENCOUNTER — Inpatient Hospital Stay (HOSPITAL_COMMUNITY)
Admission: AD | Admit: 2012-05-30 | Discharge: 2012-05-30 | Disposition: A | Discharge status: Home / Self Care | Payer: BC Managed Care – PPO | Source: Ambulatory Visit | Attending: Obstetrics and Gynecology | Admitting: Obstetrics and Gynecology

## 2012-05-30 ENCOUNTER — Encounter (HOSPITAL_COMMUNITY): Payer: Self-pay | Admitting: *Deleted

## 2012-05-30 DIAGNOSIS — O30009 Twin pregnancy, unspecified number of placenta and unspecified number of amniotic sacs, unspecified trimester: Secondary | ICD-10-CM | POA: Insufficient documentation

## 2012-05-30 DIAGNOSIS — O4700 False labor before 37 completed weeks of gestation, unspecified trimester: Secondary | ICD-10-CM

## 2012-05-30 LAB — URINALYSIS, ROUTINE W REFLEX MICROSCOPIC
Bilirubin Urine: NEGATIVE
Ketones, ur: NEGATIVE mg/dL
Nitrite: NEGATIVE
Protein, ur: NEGATIVE mg/dL
Urobilinogen, UA: 0.2 mg/dL (ref 0.0–1.0)
pH: 6 (ref 5.0–8.0)

## 2012-05-30 LAB — URINE MICROSCOPIC-ADD ON

## 2012-05-30 MED ORDER — NIFEDIPINE ER OSMOTIC RELEASE 30 MG PO TB24: 30.0000 mg | ORAL_TABLET | Freq: Every day | ORAL | Status: DC

## 2012-05-30 MED ORDER — LACTATED RINGERS IV SOLN
INTRAVENOUS | Status: DC
  Administered 2012-05-30 (×2): via INTRAVENOUS

## 2012-05-30 MED ORDER — TERBUTALINE SULFATE 1 MG/ML IJ SOLN
0.2500 mg | Freq: Once | INTRAMUSCULAR | Status: AC
  Administered 2012-05-30: 0.25 mg via SUBCUTANEOUS
  Filled 2012-05-30: qty 1

## 2012-05-30 NOTE — MAU Note (Signed)
Pt presents with complaint of contractions since 3 pm, denies ROM or bleeding. Reports increase in clear vaginal discharge. Denies problems with preg. Twins, both vertex

## 2012-05-30 NOTE — Progress Notes (Signed)
Written and verbal d/c instructions given and understanding voiced. 

## 2012-05-30 NOTE — Progress Notes (Signed)
Pt sat up and Baby A not recording. Pt up to BR and then adjusted BAby A
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
## 2012-05-31 ENCOUNTER — Ambulatory Visit (HOSPITAL_COMMUNITY)
Admission: RE | Admit: 2012-05-31 | Discharge: 2012-05-31 | Disposition: A | Discharge status: Home / Self Care | Payer: BC Managed Care – PPO | Source: Ambulatory Visit | Attending: Obstetrics and Gynecology | Admitting: Obstetrics and Gynecology

## 2012-05-31 VITALS — BP 112/72 | HR 87 | Wt 134.5 lb

## 2012-05-31 DIAGNOSIS — O30009 Twin pregnancy, unspecified number of placenta and unspecified number of amniotic sacs, unspecified trimester: Secondary | ICD-10-CM | POA: Insufficient documentation

## 2012-05-31 DIAGNOSIS — O98519 Other viral diseases complicating pregnancy, unspecified trimester: Secondary | ICD-10-CM | POA: Insufficient documentation

## 2012-05-31 DIAGNOSIS — A6000 Herpesviral infection of urogenital system, unspecified: Secondary | ICD-10-CM | POA: Insufficient documentation

## 2012-05-31 DIAGNOSIS — O30039 Twin pregnancy, monochorionic/diamniotic, unspecified trimester: Secondary | ICD-10-CM | POA: Insufficient documentation

## 2012-05-31 DIAGNOSIS — IMO0001 Reserved for inherently not codable concepts without codable children: Secondary | ICD-10-CM

## 2012-05-31 NOTE — Progress Notes (Signed)
[name] had an ultrasound appointment today.  Please see AS-OB/GYN report for details.  Comments There is an active monochorionic-diamniotic twin pregnancy on today's routine anatomic re-examination.   Twin A: Active fetus The biometry suggests a fetus with an EFW at the approximately 50th percentile for gestational age.  Normal amniotic fluid volume No dysmorphic features noted on today's images Normally-filled bladder  Twin B: Active fetus The biometry suggests a fetus with an EFW at the approximately 57th percentile for gestational age.  Normal amniotic fluid volume No dysmorphic features noted on today's images Normally-filled bladder  Impressions Active mo-di twin pair Concordant growth (4% discordance) Concordant amniotic fluid volume No evidence of twin-twin transfusion syndrome (no TTTS)  Recommendations 1. Twice weekly NSTs and weekly AFI beginning this week; 2. Tentativley plan delivery at 37 weeks for this monochorionic twin pair, provided that testing remains reassuring in the interim.  Rogelia Boga, MD, MS, FACOG Assistant Professor Section of Maternal-Fetal Medicine [facility]

## 2012-06-02 ENCOUNTER — Other Ambulatory Visit (HOSPITAL_COMMUNITY): Payer: Self-pay | Admitting: Obstetrics and Gynecology

## 2012-06-02 DIAGNOSIS — O30009 Twin pregnancy, unspecified number of placenta and unspecified number of amniotic sacs, unspecified trimester: Secondary | ICD-10-CM

## 2012-06-06 ENCOUNTER — Ambulatory Visit (HOSPITAL_COMMUNITY): Admission: RE | Admit: 2012-06-06 | Payer: BC Managed Care – PPO | Source: Ambulatory Visit

## 2012-06-06 ENCOUNTER — Ambulatory Visit (HOSPITAL_COMMUNITY)
Admission: RE | Admit: 2012-06-06 | Discharge: 2012-06-06 | Disposition: A | Discharge status: Home / Self Care | Payer: BC Managed Care – PPO | Source: Ambulatory Visit | Attending: Obstetrics and Gynecology | Admitting: Obstetrics and Gynecology

## 2012-06-06 ENCOUNTER — Other Ambulatory Visit (HOSPITAL_COMMUNITY): Payer: Self-pay | Admitting: Obstetrics and Gynecology

## 2012-06-06 VITALS — BP 130/58 | HR 90 | Wt 137.0 lb

## 2012-06-06 DIAGNOSIS — O30039 Twin pregnancy, monochorionic/diamniotic, unspecified trimester: Secondary | ICD-10-CM | POA: Insufficient documentation

## 2012-06-06 DIAGNOSIS — O98519 Other viral diseases complicating pregnancy, unspecified trimester: Secondary | ICD-10-CM | POA: Insufficient documentation

## 2012-06-06 DIAGNOSIS — O30009 Twin pregnancy, unspecified number of placenta and unspecified number of amniotic sacs, unspecified trimester: Secondary | ICD-10-CM

## 2012-06-06 DIAGNOSIS — A6000 Herpesviral infection of urogenital system, unspecified: Secondary | ICD-10-CM | POA: Insufficient documentation

## 2012-06-07 ENCOUNTER — Ambulatory Visit (HOSPITAL_COMMUNITY)

## 2012-06-12 ENCOUNTER — Telehealth (HOSPITAL_COMMUNITY): Payer: Self-pay | Admitting: *Deleted

## 2012-06-12 ENCOUNTER — Encounter (HOSPITAL_COMMUNITY): Payer: Self-pay | Admitting: *Deleted

## 2012-06-12 NOTE — Telephone Encounter (Signed)
Preadmission screen  

## 2012-06-13 ENCOUNTER — Ambulatory Visit (HOSPITAL_COMMUNITY)
Admission: RE | Admit: 2012-06-13 | Discharge: 2012-06-13 | Disposition: A | Discharge status: Home / Self Care | Payer: BC Managed Care – PPO | Source: Ambulatory Visit | Attending: Obstetrics and Gynecology | Admitting: Obstetrics and Gynecology

## 2012-06-13 ENCOUNTER — Inpatient Hospital Stay (HOSPITAL_COMMUNITY)
Admission: AD | Admit: 2012-06-13 | Discharge: 2012-06-13 | Disposition: A | Discharge status: Home / Self Care | Source: Ambulatory Visit | Attending: Obstetrics and Gynecology | Admitting: Obstetrics and Gynecology

## 2012-06-13 ENCOUNTER — Encounter (HOSPITAL_COMMUNITY): Payer: Self-pay | Admitting: Obstetrics and Gynecology

## 2012-06-13 DIAGNOSIS — O4700 False labor before 37 completed weeks of gestation, unspecified trimester: Secondary | ICD-10-CM | POA: Insufficient documentation

## 2012-06-13 DIAGNOSIS — O30009 Twin pregnancy, unspecified number of placenta and unspecified number of amniotic sacs, unspecified trimester: Secondary | ICD-10-CM | POA: Insufficient documentation

## 2012-06-13 DIAGNOSIS — O30039 Twin pregnancy, monochorionic/diamniotic, unspecified trimester: Secondary | ICD-10-CM | POA: Insufficient documentation

## 2012-06-13 DIAGNOSIS — R109 Unspecified abdominal pain: Secondary | ICD-10-CM | POA: Insufficient documentation

## 2012-06-13 DIAGNOSIS — O98519 Other viral diseases complicating pregnancy, unspecified trimester: Secondary | ICD-10-CM | POA: Insufficient documentation

## 2012-06-13 DIAGNOSIS — A6000 Herpesviral infection of urogenital system, unspecified: Secondary | ICD-10-CM | POA: Insufficient documentation
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
## 2012-06-13 NOTE — Progress Notes (Signed)
[name] had an ultrasound appointment today.  Please see AS-OB/GYN report for details.  Comments There is an active monochorionic-diamniotic twin pregnancy on today's routine anatomic re-examination. The patient reports increase leakage of fluid per vagina.  Twin A: Active fetus BPP 8/8 Normal amniotic fluid volume (MVP 3.4cm) No dysmorphic features noted on today's images Normally-filled bladder  Twin B: Active fetus BPP 8/8 Normal amniotic fluid volume (MVP 7.8cm) No dysmorphic features noted on today's images Normally-filled bladder  Impressions Active mo-di twin pair Concordant amniotic fluid volume but maternal report of LOF per vagina No evidence of twin-twin transfusion syndrome (no TTTS) Reassuring BPP x 2  Recommendations 1. The patient was sent to [facility] triage (MAU) for rule out pPROM.  If diagnosis of pPROM is confirmed, I recommend delivery. 2. Tentativley plan delivery by 37 weeks for this monochorionic twin pair, provided that testing remains reassuring in the interim.  Rogelia Boga, MD, MS, FACOG Assistant Professor Section of Maternal-Fetal Medicine [facility]

## 2012-06-13 NOTE — MAU Note (Signed)
Patient states she was seen in MFM today for an ultrasound and Baby A had an AFI of 3, B was 7. Sent to MAU for evaluation of rupture of membranes. Patient is having lower abdominal pressure and pain but not sure if it is contractions. Reports no leaking or bleeding and has good fetal movement. Scheduled for IOL tomorrow pm.

## 2012-06-14 ENCOUNTER — Inpatient Hospital Stay (HOSPITAL_COMMUNITY)
Admission: AD | Admit: 2012-06-14 | Discharge: 2012-06-18 | DRG: 765 | Disposition: A | Discharge status: Home / Self Care | Source: Ambulatory Visit | Attending: Obstetrics and Gynecology | Admitting: Obstetrics and Gynecology

## 2012-06-14 ENCOUNTER — Encounter (HOSPITAL_COMMUNITY): Payer: Self-pay | Admitting: *Deleted

## 2012-06-14 DIAGNOSIS — O30009 Twin pregnancy, unspecified number of placenta and unspecified number of amniotic sacs, unspecified trimester: Principal | ICD-10-CM | POA: Diagnosis present

## 2012-06-14 DIAGNOSIS — O4100X0 Oligohydramnios, unspecified trimester, not applicable or unspecified: Secondary | ICD-10-CM | POA: Diagnosis present

## 2012-06-14 DIAGNOSIS — O30039 Twin pregnancy, monochorionic/diamniotic, unspecified trimester: Secondary | ICD-10-CM

## 2012-06-14 LAB — CBC
HCT: 40.8 % (ref 36.0–46.0)
Hemoglobin: 14.1 g/dL (ref 12.0–15.0)
MCH: 33.2 pg (ref 26.0–34.0)
MCV: 96 fL (ref 78.0–100.0)
Platelets: 214 10*3/uL (ref 150–400)
RBC: 4.25 MIL/uL (ref 3.87–5.11)
WBC: 9.2 10*3/uL (ref 4.0–10.5)

## 2012-06-14 LAB — TYPE AND SCREEN
ABO/RH(D): A POS
Antibody Screen: NEGATIVE

## 2012-06-14 LAB — ABO/RH: ABO/RH(D): A POS

## 2012-06-14 MED ORDER — LACTATED RINGERS IV SOLN
INTRAVENOUS | Status: DC
  Administered 2012-06-14 – 2012-06-15 (×4): via INTRAVENOUS

## 2012-06-14 MED ORDER — IBUPROFEN 600 MG PO TABS: 600.0000 mg | ORAL_TABLET | Freq: Four times a day (QID) | ORAL | Status: DC | PRN

## 2012-06-14 MED ORDER — LACTATED RINGERS IV SOLN
500.0000 mL | INTRAVENOUS | Status: DC | PRN
  Administered 2012-06-15: 350 mL via INTRAVENOUS

## 2012-06-14 MED ORDER — EPHEDRINE 5 MG/ML INJ: 10.0000 mg | INTRAVENOUS | Status: DC | PRN

## 2012-06-14 MED ORDER — ACETAMINOPHEN 325 MG PO TABS: 650.0000 mg | ORAL_TABLET | ORAL | Status: DC | PRN

## 2012-06-14 MED ORDER — VALACYCLOVIR HCL 500 MG PO TABS
500.0000 mg | ORAL_TABLET | Freq: Every day | ORAL | Status: DC
  Administered 2012-06-15: 500 mg via ORAL
  Filled 2012-06-14 (×2): qty 1

## 2012-06-14 MED ORDER — OXYCODONE-ACETAMINOPHEN 5-325 MG PO TABS: 1.0000 | ORAL_TABLET | ORAL | Status: DC | PRN

## 2012-06-14 MED ORDER — TERBUTALINE SULFATE 1 MG/ML IJ SOLN: 0.2500 mg | Freq: Once | INTRAMUSCULAR | Status: AC | PRN

## 2012-06-14 MED ORDER — FLEET ENEMA 7-19 GM/118ML RE ENEM: 1.0000 | ENEMA | RECTAL | Status: DC | PRN

## 2012-06-14 MED ORDER — DIPHENHYDRAMINE HCL 50 MG/ML IJ SOLN: 12.5000 mg | INTRAMUSCULAR | Status: DC | PRN

## 2012-06-14 MED ORDER — LACTATED RINGERS IV SOLN: 500.0000 mL | Freq: Once | INTRAVENOUS | Status: DC

## 2012-06-14 MED ORDER — LIDOCAINE HCL (PF) 1 % IJ SOLN: 30.0000 mL | INTRAMUSCULAR | Status: DC | PRN

## 2012-06-14 MED ORDER — PHENYLEPHRINE 40 MCG/ML (10ML) SYRINGE FOR IV PUSH (FOR BLOOD PRESSURE SUPPORT): 80.0000 ug | PREFILLED_SYRINGE | INTRAVENOUS | Status: DC | PRN

## 2012-06-14 MED ORDER — OXYTOCIN 40 UNITS IN LACTATED RINGERS INFUSION - SIMPLE MED
1.0000 m[IU]/min | INTRAVENOUS | Status: DC
  Administered 2012-06-14 (×2): 1 m[IU]/min via INTRAVENOUS

## 2012-06-14 MED ORDER — CITRIC ACID-SODIUM CITRATE 334-500 MG/5ML PO SOLN
30.0000 mL | ORAL | Status: DC | PRN
  Administered 2012-06-15: 30 mL via ORAL
  Filled 2012-06-14: qty 15

## 2012-06-14 MED ORDER — OXYTOCIN BOLUS FROM INFUSION: 500.0000 mL | INTRAVENOUS | Status: DC

## 2012-06-14 MED ORDER — ONDANSETRON HCL 4 MG/2ML IJ SOLN: 4.0000 mg | Freq: Four times a day (QID) | INTRAMUSCULAR | Status: DC | PRN

## 2012-06-14 MED ORDER — FENTANYL 2.5 MCG/ML BUPIVACAINE 1/10 % EPIDURAL INFUSION (WH - ANES): 14.0000 mL/h | INTRAMUSCULAR | Status: DC

## 2012-06-14 MED ORDER — OXYTOCIN 40 UNITS IN LACTATED RINGERS INFUSION - SIMPLE MED
62.5000 mL/h | INTRAVENOUS | Status: DC
  Filled 2012-06-14: qty 1000
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
## 2012-06-14 NOTE — Progress Notes (Signed)
Pt reports last HSV outbreak was 1.5weeks ago.  Claiborne Billings notified

## 2012-06-15 ENCOUNTER — Inpatient Hospital Stay (HOSPITAL_COMMUNITY): Admission: RE | Admit: 2012-06-15 | Source: Ambulatory Visit

## 2012-06-15 LAB — POCT FERN TEST: POCT Fern Test: NEGATIVE

## 2012-06-15 MED ORDER — PHENYLEPHRINE 40 MCG/ML (10ML) SYRINGE FOR IV PUSH (FOR BLOOD PRESSURE SUPPORT): 80.0000 ug | PREFILLED_SYRINGE | INTRAVENOUS | Status: DC | PRN

## 2012-06-15 MED ORDER — LACTATED RINGERS IV SOLN: 500.0000 mL | Freq: Once | INTRAVENOUS | Status: DC

## 2012-06-15 MED ORDER — EPHEDRINE 5 MG/ML INJ: 10.0000 mg | INTRAVENOUS | Status: DC | PRN

## 2012-06-15 MED ORDER — DIPHENHYDRAMINE HCL 50 MG/ML IJ SOLN: 12.5000 mg | INTRAMUSCULAR | Status: DC | PRN

## 2012-06-15 MED ORDER — ZOLPIDEM TARTRATE 5 MG PO TABS
5.0000 mg | ORAL_TABLET | Freq: Every evening | ORAL | Status: DC | PRN
  Administered 2012-06-15: 5 mg via ORAL
  Filled 2012-06-15: qty 1

## 2012-06-15 MED ORDER — FENTANYL 2.5 MCG/ML BUPIVACAINE 1/10 % EPIDURAL INFUSION (WH - ANES): 14.0000 mL/h | INTRAMUSCULAR | Status: DC

## 2012-06-15 MED ORDER — MISOPROSTOL 25 MCG QUARTER TABLET
25.0000 ug | ORAL_TABLET | ORAL | Status: DC
  Administered 2012-06-15 (×2): 25 ug via VAGINAL
  Filled 2012-06-15 (×3): qty 1
  Filled 2012-06-15 (×2): qty 0.25

## 2012-06-15 NOTE — Progress Notes (Signed)
Patient ID: Melinda Bell, female   DOB: 01/09/1986, 26 y.o.   MRN: [id number]   S: Comfortable, feeling contractions but not uncomfortable O: AFVSS Gravid FHT 140-150 x 2 reactive cvx FT/th/-2 toco Q2  A/P 1) Cytotec #2 placed by me 2) FWB reassuring

## 2012-06-15 NOTE — Progress Notes (Signed)
Patient ID: Melinda Bell, female   DOB: 06-26-1986, 26 y.o.   MRN: [id number]   S: Pt feeling fed up.  She is frustrated that she is only 1 cm dilated.  She feels contractions but they're not uncomfortable. O: Filed Vitals:   06/15/12 2236 06/15/12 2245 06/15/12 2248 06/15/12 2337  BP:   128/81 140/90  Pulse: 81 75 84 86  Temp:    98.1 F (36.7 C)  TempSrc:    Oral  Resp:   18 20  Height:      Weight:      SpO2: 100% 100%     AOX3, NAD Gravid soft NT FHT130-140 reactive x 2 cvx 1/50/-2 toco irregular  A/P 1) Options discussed including additional cytotec, foley catheter and pitocin, or cesarean.  Pt declines further attempts at labor induction and wishes to proceed with cesarean.  R/B/A reviewed.  Will proceed with primary cesarean

## 2012-06-15 NOTE — Progress Notes (Signed)
Dr Tenny Craw notified of pt progress, FHR tracing, UCs freq, and pain level. On way to unit for POC

## 2012-06-15 NOTE — Progress Notes (Signed)
Patient ID: Melinda Bell, female   DOB: [birth year], 26 y.o.   MRN: [id number]   S: Pt admitted for Induction of labor for mono/di twins and oligohydramnios.  At time of admission cvx closed.  Was to receive cervical ripening but cxt too frequent.  Only received intermittent low dose pit overnight.  This morning cervix still closed.  Fetal movement x 2 O: AFVSS FHT 130-140 x 2 reactive cvx closed toco irritability A/P 1) Stop pit 2) Allow pt to shower and eat 3) Place vaginal misoprostal 

## 2012-06-15 NOTE — Progress Notes (Signed)
Dr. Tenny Craw notified of pt's SVE and updated on pt's UC's and FHR. States she will come in a little later and place another cytotec.

## 2012-06-16 ENCOUNTER — Encounter (HOSPITAL_COMMUNITY): Payer: Self-pay | Admitting: *Deleted

## 2012-06-16 ENCOUNTER — Encounter (HOSPITAL_COMMUNITY): Payer: Self-pay | Admitting: Anesthesiology

## 2012-06-16 ENCOUNTER — Inpatient Hospital Stay (HOSPITAL_COMMUNITY): Admitting: Anesthesiology

## 2012-06-16 ENCOUNTER — Encounter (HOSPITAL_COMMUNITY)
Admission: AD | Disposition: A | Discharge status: Home / Self Care | Payer: Self-pay | Source: Ambulatory Visit | Attending: Obstetrics and Gynecology

## 2012-06-16 LAB — CBC
MCH: 32.9 pg (ref 26.0–34.0)
MCHC: 34.7 g/dL (ref 30.0–36.0)
RDW: 13.8 % (ref 11.5–15.5)

## 2012-06-16 SURGERY — Surgical Case
Anesthesia: Spinal | Site: Abdomen | Wound class: Clean Contaminated

## 2012-06-16 MED ORDER — ONDANSETRON HCL 4 MG PO TABS: 4.0000 mg | ORAL_TABLET | ORAL | Status: DC | PRN

## 2012-06-16 MED ORDER — ONDANSETRON HCL 4 MG/2ML IJ SOLN: 4.0000 mg | Freq: Three times a day (TID) | INTRAMUSCULAR | Status: DC | PRN

## 2012-06-16 MED ORDER — LACTATED RINGERS IV SOLN
INTRAVENOUS | Status: DC
  Administered 2012-06-16: 14:00:00 via INTRAVENOUS

## 2012-06-16 MED ORDER — WITCH HAZEL-GLYCERIN EX PADS: 1.0000 "application " | MEDICATED_PAD | CUTANEOUS | Status: DC | PRN

## 2012-06-16 MED ORDER — MENTHOL 3 MG MT LOZG: 1.0000 | LOZENGE | OROMUCOSAL | Status: DC | PRN

## 2012-06-16 MED ORDER — ONDANSETRON HCL 4 MG/2ML IJ SOLN
INTRAMUSCULAR | Status: DC | PRN
  Administered 2012-06-16: 4 mg via INTRAVENOUS

## 2012-06-16 MED ORDER — ONDANSETRON HCL 4 MG/2ML IJ SOLN
INTRAMUSCULAR | Status: AC
  Filled 2012-06-16: qty 2

## 2012-06-16 MED ORDER — KETOROLAC TROMETHAMINE 30 MG/ML IJ SOLN: 30.0000 mg | Freq: Four times a day (QID) | INTRAMUSCULAR | Status: AC | PRN

## 2012-06-16 MED ORDER — FENTANYL CITRATE 0.05 MG/ML IJ SOLN
INTRAMUSCULAR | Status: AC
  Filled 2012-06-16: qty 2

## 2012-06-16 MED ORDER — ONDANSETRON HCL 4 MG/2ML IJ SOLN: 4.0000 mg | INTRAMUSCULAR | Status: DC | PRN

## 2012-06-16 MED ORDER — BUPIVACAINE IN DEXTROSE 0.75-8.25 % IT SOLN
INTRATHECAL | Status: DC | PRN
  Administered 2012-06-16: 1.5 mL via INTRATHECAL

## 2012-06-16 MED ORDER — PHENYLEPHRINE HCL 10 MG/ML IJ SOLN
INTRAMUSCULAR | Status: DC | PRN
  Administered 2012-06-16 (×2): 80 ug via INTRAVENOUS
  Administered 2012-06-16: 40 ug via INTRAVENOUS
  Administered 2012-06-16 (×2): 80 ug via INTRAVENOUS
  Administered 2012-06-16: 40 ug via INTRAVENOUS

## 2012-06-16 MED ORDER — DIBUCAINE 1 % RE OINT: 1.0000 "application " | TOPICAL_OINTMENT | RECTAL | Status: DC | PRN

## 2012-06-16 MED ORDER — MORPHINE SULFATE 0.5 MG/ML IJ SOLN
INTRAMUSCULAR | Status: AC
  Filled 2012-06-16: qty 10

## 2012-06-16 MED ORDER — LANOLIN HYDROUS EX OINT: 1.0000 "application " | TOPICAL_OINTMENT | CUTANEOUS | Status: DC | PRN

## 2012-06-16 MED ORDER — OXYTOCIN 40 UNITS IN LACTATED RINGERS INFUSION - SIMPLE MED: 62.5000 mL/h | INTRAVENOUS | Status: AC

## 2012-06-16 MED ORDER — PHENYLEPHRINE 40 MCG/ML (10ML) SYRINGE FOR IV PUSH (FOR BLOOD PRESSURE SUPPORT)
PREFILLED_SYRINGE | INTRAVENOUS | Status: AC
  Filled 2012-06-16: qty 10

## 2012-06-16 MED ORDER — 0.9 % SODIUM CHLORIDE (POUR BTL) OPTIME
TOPICAL | Status: DC | PRN
  Administered 2012-06-16: 1000 mL

## 2012-06-16 MED ORDER — OXYTOCIN 10 UNIT/ML IJ SOLN
INTRAMUSCULAR | Status: AC
  Filled 2012-06-16: qty 1

## 2012-06-16 MED ORDER — DIPHENHYDRAMINE HCL 50 MG/ML IJ SOLN: 12.5000 mg | INTRAMUSCULAR | Status: DC | PRN

## 2012-06-16 MED ORDER — OXYTOCIN 10 UNIT/ML IJ SOLN
40.0000 [IU] | INTRAVENOUS | Status: DC | PRN
  Administered 2012-06-16: 40 [IU] via INTRAVENOUS

## 2012-06-16 MED ORDER — DIPHENHYDRAMINE HCL 50 MG/ML IJ SOLN: 25.0000 mg | INTRAMUSCULAR | Status: DC | PRN

## 2012-06-16 MED ORDER — KETOROLAC TROMETHAMINE 60 MG/2ML IM SOLN
60.0000 mg | Freq: Once | INTRAMUSCULAR | Status: AC | PRN
  Administered 2012-06-16: 60 mg via INTRAMUSCULAR

## 2012-06-16 MED ORDER — DIPHENHYDRAMINE HCL 25 MG PO CAPS: 25.0000 mg | ORAL_CAPSULE | ORAL | Status: DC | PRN

## 2012-06-16 MED ORDER — NALOXONE HCL 0.4 MG/ML IJ SOLN: 0.4000 mg | INTRAMUSCULAR | Status: DC | PRN

## 2012-06-16 MED ORDER — IBUPROFEN 600 MG PO TABS: 600.0000 mg | ORAL_TABLET | Freq: Four times a day (QID) | ORAL | Status: DC | PRN

## 2012-06-16 MED ORDER — TETANUS-DIPHTH-ACELL PERTUSSIS 5-2.5-18.5 LF-MCG/0.5 IM SUSP
0.5000 mL | Freq: Once | INTRAMUSCULAR | Status: AC
  Administered 2012-06-17: 0.5 mL via INTRAMUSCULAR

## 2012-06-16 MED ORDER — SENNOSIDES-DOCUSATE SODIUM 8.6-50 MG PO TABS
2.0000 | ORAL_TABLET | Freq: Every day | ORAL | Status: DC
  Administered 2012-06-16 – 2012-06-17 (×2): 2 via ORAL

## 2012-06-16 MED ORDER — PRENATAL MULTIVITAMIN CH
1.0000 | ORAL_TABLET | Freq: Every day | ORAL | Status: DC
  Administered 2012-06-17 – 2012-06-18 (×2): 1 via ORAL
  Filled 2012-06-16 (×2): qty 1

## 2012-06-16 MED ORDER — OXYCODONE-ACETAMINOPHEN 5-325 MG PO TABS: 1.0000 | ORAL_TABLET | ORAL | Status: DC | PRN

## 2012-06-16 MED ORDER — FENTANYL CITRATE 0.05 MG/ML IJ SOLN
25.0000 ug | INTRAMUSCULAR | Status: DC | PRN
  Administered 2012-06-16: 50 ug via INTRAVENOUS

## 2012-06-16 MED ORDER — MEPERIDINE HCL 25 MG/ML IJ SOLN
INTRAMUSCULAR | Status: AC
Start: 2012-06-16 — End: 2012-06-16
  Filled 2012-06-16: qty 1

## 2012-06-16 MED ORDER — SIMETHICONE 80 MG PO CHEW: 80.0000 mg | CHEWABLE_TABLET | ORAL | Status: DC | PRN

## 2012-06-16 MED ORDER — NALBUPHINE HCL 10 MG/ML IJ SOLN
5.0000 mg | INTRAMUSCULAR | Status: DC | PRN
  Filled 2012-06-16 (×2): qty 1

## 2012-06-16 MED ORDER — NALBUPHINE HCL 10 MG/ML IJ SOLN
5.0000 mg | INTRAMUSCULAR | Status: DC | PRN
  Filled 2012-06-16: qty 1

## 2012-06-16 MED ORDER — IBUPROFEN 600 MG PO TABS: 600.0000 mg | ORAL_TABLET | Freq: Four times a day (QID) | ORAL | Status: DC

## 2012-06-16 MED ORDER — SODIUM CHLORIDE 0.9 % IJ SOLN: 3.0000 mL | INTRAMUSCULAR | Status: DC | PRN

## 2012-06-16 MED ORDER — KETOROLAC TROMETHAMINE 30 MG/ML IJ SOLN
30.0000 mg | Freq: Four times a day (QID) | INTRAMUSCULAR | Status: AC | PRN
  Administered 2012-06-16: 30 mg via INTRAVENOUS
  Filled 2012-06-16: qty 1

## 2012-06-16 MED ORDER — KETOROLAC TROMETHAMINE 30 MG/ML IJ SOLN: 30.0000 mg | Freq: Four times a day (QID) | INTRAMUSCULAR | Status: DC | PRN

## 2012-06-16 MED ORDER — PHENYLEPHRINE 40 MCG/ML (10ML) SYRINGE FOR IV PUSH (FOR BLOOD PRESSURE SUPPORT)
PREFILLED_SYRINGE | INTRAVENOUS | Status: AC
  Filled 2012-06-16: qty 5

## 2012-06-16 MED ORDER — MORPHINE SULFATE (PF) 0.5 MG/ML IJ SOLN
INTRAMUSCULAR | Status: DC | PRN
  Administered 2012-06-16: .1 mg via INTRATHECAL

## 2012-06-16 MED ORDER — CEFAZOLIN SODIUM-DEXTROSE 2-3 GM-% IV SOLR
INTRAVENOUS | Status: DC | PRN
  Administered 2012-06-16: 2 g via INTRAVENOUS

## 2012-06-16 MED ORDER — IBUPROFEN 600 MG PO TABS
600.0000 mg | ORAL_TABLET | Freq: Four times a day (QID) | ORAL | Status: DC
  Administered 2012-06-16 – 2012-06-18 (×7): 600 mg via ORAL
  Filled 2012-06-16 (×7): qty 1

## 2012-06-16 MED ORDER — ZOLPIDEM TARTRATE 5 MG PO TABS: 5.0000 mg | ORAL_TABLET | Freq: Every evening | ORAL | Status: DC | PRN

## 2012-06-16 MED ORDER — KETOROLAC TROMETHAMINE 60 MG/2ML IM SOLN
INTRAMUSCULAR | Status: AC
  Filled 2012-06-16: qty 2

## 2012-06-16 MED ORDER — FENTANYL CITRATE 0.05 MG/ML IJ SOLN
INTRAMUSCULAR | Status: DC | PRN
  Administered 2012-06-16: 15 ug via INTRATHECAL
  Administered 2012-06-16: 35 ug via INTRAVENOUS

## 2012-06-16 MED ORDER — NALOXONE HCL 1 MG/ML IJ SOLN
1.0000 ug/kg/h | INTRAVENOUS | Status: DC | PRN
  Filled 2012-06-16: qty 2

## 2012-06-16 MED ORDER — SCOPOLAMINE 1 MG/3DAYS TD PT72
1.0000 | MEDICATED_PATCH | Freq: Once | TRANSDERMAL | Status: DC
  Administered 2012-06-16: 1.5 mg via TRANSDERMAL

## 2012-06-16 MED ORDER — LACTATED RINGERS IV SOLN
INTRAVENOUS | Status: DC | PRN
  Administered 2012-06-16 (×3): via INTRAVENOUS

## 2012-06-16 MED ORDER — DIPHENHYDRAMINE HCL 25 MG PO CAPS: 25.0000 mg | ORAL_CAPSULE | Freq: Four times a day (QID) | ORAL | Status: DC | PRN

## 2012-06-16 MED ORDER — MEPERIDINE HCL 25 MG/ML IJ SOLN
6.2500 mg | INTRAMUSCULAR | Status: AC | PRN
  Administered 2012-06-16 (×2): 6.25 mg via INTRAVENOUS

## 2012-06-16 MED ORDER — METOCLOPRAMIDE HCL 5 MG/ML IJ SOLN: 10.0000 mg | Freq: Three times a day (TID) | INTRAMUSCULAR | Status: DC | PRN

## 2012-06-16 MED ORDER — SIMETHICONE 80 MG PO CHEW
80.0000 mg | CHEWABLE_TABLET | Freq: Three times a day (TID) | ORAL | Status: DC
  Administered 2012-06-16 – 2012-06-18 (×8): 80 mg via ORAL

## 2012-06-16 MED ORDER — SCOPOLAMINE 1 MG/3DAYS TD PT72
MEDICATED_PATCH | TRANSDERMAL | Status: AC
  Filled 2012-06-16: qty 1

## 2012-06-16 MED ORDER — CEFAZOLIN SODIUM-DEXTROSE 2-3 GM-% IV SOLR
INTRAVENOUS | Status: AC
  Filled 2012-06-16: qty 50

## 2012-06-16 SURGICAL SUPPLY — 37 items
CLAMP CORD UMBIL (MISCELLANEOUS) ×4 IMPLANT
CLOTH BEACON ORANGE TIMEOUT ST (SAFETY) ×2 IMPLANT
DERMABOND ADVANCED (GAUZE/BANDAGES/DRESSINGS) ×1
DERMABOND ADVANCED .7 DNX12 (GAUZE/BANDAGES/DRESSINGS) ×1 IMPLANT
DRAPE PROXIMA HALF (DRAPES) ×2 IMPLANT
DRESSING TELFA 8X3 (GAUZE/BANDAGES/DRESSINGS) IMPLANT
DRSG COVADERM 4X10 (GAUZE/BANDAGES/DRESSINGS) IMPLANT
DURAPREP 26ML APPLICATOR (WOUND CARE) ×2 IMPLANT
ELECT REM PT RETURN 9FT ADLT (ELECTROSURGICAL) ×2
ELECTRODE REM PT RTRN 9FT ADLT (ELECTROSURGICAL) ×1 IMPLANT
EXTRACTOR VACUUM M CUP 4 TUBE (SUCTIONS) IMPLANT
GAUZE SPONGE 4X4 12PLY STRL LF (GAUZE/BANDAGES/DRESSINGS) IMPLANT
GLOVE BIO SURGEON STRL SZ7 (GLOVE) ×4 IMPLANT
GOWN PREVENTION PLUS LG XLONG (DISPOSABLE) ×4 IMPLANT
KIT ABG SYR 3ML LUER SLIP (SYRINGE) IMPLANT
NDL SAFETY ECLIPSE 18X1.5 (NEEDLE) ×1 IMPLANT
NEEDLE HYPO 18GX1.5 SHARP (NEEDLE) ×1
NEEDLE HYPO 25X5/8 SAFETYGLIDE (NEEDLE) IMPLANT
NS IRRIG 1000ML POUR BTL (IV SOLUTION) ×2 IMPLANT
PACK C SECTION WH (CUSTOM PROCEDURE TRAY) ×2 IMPLANT
PAD ABD 7.5X8 STRL (GAUZE/BANDAGES/DRESSINGS) IMPLANT
PAD OB MATERNITY 4.3X12.25 (PERSONAL CARE ITEMS) ×2 IMPLANT
RTRCTR C-SECT PINK 25CM LRG (MISCELLANEOUS) ×2 IMPLANT
RTRCTR C-SECT PINK 34CM XLRG (MISCELLANEOUS) IMPLANT
SLEEVE SCD COMPRESS KNEE MED (MISCELLANEOUS) ×2 IMPLANT
STAPLER VISISTAT 35W (STAPLE) IMPLANT
SUT CHROMIC 1 CTX 36 (SUTURE) ×4 IMPLANT
SUT CHROMIC 3 0 SH 27 (SUTURE) ×2 IMPLANT
SUT PDS AB 0 CTX 60 (SUTURE) ×2 IMPLANT
SUT VIC AB 2-0 CT1 27 (SUTURE) ×2
SUT VIC AB 2-0 CT1 TAPERPNT 27 (SUTURE) ×2 IMPLANT
SUT VIC AB 4-0 KS 27 (SUTURE) ×2 IMPLANT
SYR BULB 3OZ (MISCELLANEOUS) ×2 IMPLANT
SYRINGE 10CC LL (SYRINGE) ×2 IMPLANT
TOWEL OR 17X24 6PK STRL BLUE (TOWEL DISPOSABLE) ×4 IMPLANT
TRAY FOLEY CATH 14FR (SET/KITS/TRAYS/PACK) ×2 IMPLANT
WATER STERILE IRR 1000ML POUR (IV SOLUTION) IMPLANT

## 2012-06-16 NOTE — Transfer of Care (Signed)
Immediate Anesthesia Transfer of Care Note  Patient: Melinda Bell  Procedure(s) Performed: Procedure(s) (LRB) with comments: CESAREAN SECTION (N/A) - Primary Cesarean Section Twins Baby "A"  Boy @ 0104, Apgars  7/9,    Baby "B" Boy @ 0105, Apgars 8/9  Patient Location: PACU  Anesthesia Type:Spinal  Level of Consciousness: awake, alert  and oriented  Airway & Oxygen Therapy: Patient Spontanous Breathing  Post-op Assessment: Report given to PACU RN  Post vital signs: Reviewed and stable  Complications: No apparent anesthesia complications

## 2012-06-16 NOTE — Op Note (Signed)
Pre-Operative Diagnosis: 1) 37 week intrauterine monoamniotic/dichorionic twin pregnancy 2) oligohydramnios x 2 3) Prolonged induction of labor 4) Patient requested cesarean section Postoperative Diagnosis: Same Procedure: Primary Low Transverse Cesarean Section via  Pfannenstiel skin incision Surgeon: Dr. Waynard Reeds Assistant:None Operative Findings: Vigorous female infants in vertex, vertex presentation.  The first baby delivered was in utero baby B and the second baby delivered was in utero baby A.  The first baby delivered was the smaller of the 2 infants.  Apgars were 8 & 9.  This baby was transferred to the NICU due to smaller size and need for respiratory support.  The second baby delivered had apgars of 8 & 9 and was transferred to the Newborn nursery.  Normal appearing ovaries, tubes, and uterus Specimen: Placenta to Path [id number] I/O In: 1000 [I.V.:1000] Out: 1000 [Urine:200; Blood:800]   Procedure:[name] is an 26 year old gravida 1 para 0 at 37 weeks and 1 days estimated gestational age who presents for cesarean section. The patient was admitted for induction of labor due to monoamniotic dichorionic twin gestation and oligohydramnios per recommendations of maternal-fetal medicine. The patient had an unfavorable cervix at the beginning of the induction. She received cytotec for cervical ripening however she was having a slow progression of labor and 36 hours and her induction her cervix was only a centimeter dilated 50% effaced. At this point the patient was frustrated and did not wish to continue with induction of labor. Therefore the decision was made to proceed with primary cesarean section for delivery. Following the appropriate informed consent the patient was brought to the operating room where spinal anesthesia was administered and found to be adequate. She was placed in the dorsal supine position with a leftward tilt. She was prepped and draped in the normal sterile fashion.  Scalpel was then used to make a Pfannenstiel skin incision which was carried down to the underlying layers of soft tissue to the fascia. The fascia was incised in the midline and the fascial incision was extended laterally with Mayo scissors. The superior aspect of the fascial incision was grasped with Coker clamps x2, tented up and the rectus muscles dissected off sharply with the electrocautery unit area and the same procedure was repeated on the inferior aspect of the fascial incision. The rectus muscles were separated in the midline. The abdominal peritoneum was identified, tented up, entered sharply, and the incision was extended superiorly and inferiorly with good visualization of the bladder. The Alexis retractor was then deployed. The vesicouterine peritoneum was identified, tented up, entered sharply, and the bladder flap was created digitally. Scalpel was then used to make a low transverse incision on the uterus which was extended laterally with blunt dissection. The first fetus encountered was in utero baby B. The vertex was identified and the infants head was delivered easily followed by the body. The cord was clamped and cut x2 and the baby was bulb suctioned on the operative field. The second baby delivered was in utero baby A. The vertex was identified and easily delivered through the uterine incision followed by the body. His cord was also clamped and cut. The second baby delivered was first transported to the waiting NICU team. Followed by transport of this first baby delivered. The placenta was then delivered spontaneously, the uterus was cleared of all clot and debris. The uterine incision was repaired with #1 chromic in running locked fashion followed by a second imbricating layer. A 2 cm long rent in the broad ligament was noted on  the left-hand aspect of the portion of the broad ligament near the insertion of the round ligament. This was closed with 3-0 chromic in a running fashion. Ovaries and  tubes were inspected and normal. The Alexis retractor was removed. The uterus was returned to the abdominal cavity the abdominal cavity was cleared of all clot and debris. The abdominal peritoneum was reapproximated with 2-0 Vicryl in a running fashion, the rectus muscles was reapproximated with 2-0 vicryl in a running fashion. The fascia was closed with a looped PDS in a running fashion. The skin was closed with 4-0 vicryl in a subcuticular fashion and Dermabond. All sponge lap and needle counts were correct x2. Patient tolerated the procedure well and recovered in stable condition following the procedure.

## 2012-06-16 NOTE — Anesthesia Postprocedure Evaluation (Signed)
  Anesthesia Post-op Note  Patient: Melinda Bell  Procedure(s) Performed: Procedure(s) (LRB) with comments: CESAREAN SECTION (N/A) - Primary Cesarean Section Twins Baby "A"  Boy @ 0104, Apgars  7/9,    Baby "B" Boy @ 0105, Apgars 8/9  Patient Location: PACU  Anesthesia Type:Epidural  Level of Consciousness: awake, alert , oriented and patient cooperative  Airway and Oxygen Therapy: Patient Spontanous Breathing  Post-op Pain: none  Post-op Assessment: Post-op Vital signs reviewed and Patient's Cardiovascular Status Stable  Post-op Vital Signs: Reviewed and stable  Complications: No apparent anesthesia complications

## 2012-06-16 NOTE — Anesthesia Preprocedure Evaluation (Signed)
Anesthesia Evaluation  Patient identified by MRN, date of birth, ID band Patient awake    Reviewed: Allergy & Precautions, H&P , NPO status , Patient's Chart, lab work & pertinent test results, reviewed documented beta blocker date and time   History of Anesthesia Complications Negative for: history of anesthetic complications  Airway Mallampati: I TM Distance: >3 FB Neck ROM: full    Dental  (+) Teeth Intact   Pulmonary neg pulmonary ROS,  breath sounds clear to auscultation        Cardiovascular negative cardio ROS  Rhythm:regular Rate:Normal     Neuro/Psych negative neurological ROS  negative psych ROS   GI/Hepatic negative GI ROS, Neg liver ROS,   Endo/Other  negative endocrine ROS  Renal/GU negative Renal ROS     Musculoskeletal   Abdominal   Peds  Hematology negative hematology ROS (+)   Anesthesia Other Findings   Reproductive/Obstetrics (+) Pregnancy (twins, failed IOL)                           Anesthesia Physical Anesthesia Plan  ASA: II  Anesthesia Plan: Spinal   Post-op Pain Management:    Induction:   Airway Management Planned:   Additional Equipment:   Intra-op Plan:   Post-operative Plan:   Informed Consent: I have reviewed the patients History and Physical, chart, labs and discussed the procedure including the risks, benefits and alternatives for the proposed anesthesia with the patient or authorized representative who has indicated his/her understanding and acceptance.     Plan Discussed with: Surgeon and CRNA  Anesthesia Plan Comments:         Anesthesia Quick Evaluation

## 2012-06-16 NOTE — Addendum Note (Signed)
Addendum  created 06/16/12 0856 by Orlie Pollen, CRNA   Modules edited:Notes Section

## 2012-06-16 NOTE — Discharge Summary (Signed)
Obstetric Discharge Summary Reason for Admission: induction of labor and mono/di twins Prenatal Procedures: ultrasound Intrapartum Procedures: cesarean: low cervical, transverse Postpartum Procedures: none Complications-Operative and Postpartum: none Hemoglobin  Date Value Range Status  06/16/2012 14.2  12.0 - 15.0 g/dL Final     HCT  Date Value Range Status  06/16/2012 40.9  36.0 - 46.0 % Final   Hospital Course:  Pt admitted for induction at term for mono/di twins.  After several doses of cytotec and pitocin, the pt's cervix remained at 1 cm.  The pt desired c/s as opposed to prolonged induction and she was counseled about the risks.  She underwent an uncomplicated LTCS and by POD 2 was doing well.  Baby A (the smaller of the twins) was found on DOL 1 to have transposition of the great arteries and was transferred to [facility].  Baby B was circumcised on DOL 2.  Mom d/ced to home without complications.  Discharge Diagnoses: Term Pregnancy-delivered and mono/di twins  Discharge Information: Date: 06/16/2012 Activity: pelvic rest Diet: routine Medications: Percocet, iron Condition: stable Instructions: refer to practice specific booklet Discharge to: home Follow-up Information    Follow up with Almon Hercules., MD. Schedule an appointment as soon as possible for a visit in 4 weeks.   Contact information:   [address] [place] [address] [place] [address] [phone]          Newborn Data:   Tahiry, Spicer [[id number]]  Live born female  Birth Weight: 5 lb 7.8 oz (2490 g) APGAR: 7, 9   Cherie, Lasalle [[id number]]  Live born female  Birth Weight: 5 lb 15.6 oz (2710 g) APGAR: 8, 9  Home with Twin B home with mom; Twin A at [facility] for transposition of the great arteries.  Xan Sparkman A 06/16/2012, 8:08 AM

## 2012-06-16 NOTE — Anesthesia Procedure Notes (Signed)
Spinal  Patient location during procedure: OR Start time: 06/16/2012 12:43 AM Staffing Performed by: anesthesiologist  Preanesthetic Checklist Completed: patient identified, site marked, surgical consent, pre-op evaluation, timeout performed, IV checked, risks and benefits discussed and monitors and equipment checked Spinal Block Patient position: sitting Prep: site prepped and draped and DuraPrep Patient monitoring: blood pressure, continuous pulse ox and heart rate Approach: midline Location: L3-4 Injection technique: single-shot Needle Needle type: Sprotte  Needle gauge: 24 G Needle length: 9 cm Assessment Sensory level: T4 Additional Notes Clear free flow CSF on first attempt.  No paresthesia.  Patient tolerated procedure well.  Jasmine December, MD

## 2012-06-16 NOTE — Anesthesia Postprocedure Evaluation (Signed)
Anesthesia Post Note  Patient: Melinda Bell  Procedure(s) Performed: Procedure(s) (LRB): CESAREAN SECTION (N/A)  Anesthesia type: Spinal  Patient location: PACU  Post pain: Pain level controlled  Post assessment: Post-op Vital signs reviewed  Last Vitals:  Filed Vitals:   06/16/12 0300  BP: 125/80  Pulse: 77  Temp:   Resp: 18    Post vital signs: Reviewed  Level of consciousness: awake  Complications: No apparent anesthesia complications

## 2012-06-16 NOTE — Progress Notes (Addendum)
  Patient is eating, ambulating, voiding.  Pain control is good.  Filed Vitals:   06/16/12 0300 06/16/12 0329 06/16/12 0456 06/16/12 0600  BP: 125/80 126/86 129/91 132/88  Pulse: 77 79 82 80  Temp:  98.1 F (36.7 C) 98.4 F (36.9 C) 98.3 F (36.8 C)  TempSrc:   Oral Oral  Resp: 18 16 18 18   Height:      Weight:      SpO2: 100% 97% 98% 96%    lungs:   clear to auscultation cor:    RRR Abdomen:  soft, appropriate tenderness, incisions intact and without erythema or exudate ex:    no cords   Lab Results  Component Value Date   WBC 11.9* 06/16/2012   HGB 14.2 06/16/2012   HCT 40.9 06/16/2012   MCV 94.9 06/16/2012   PLT 187 06/16/2012    --/--/A POS, A POS (11/20 1230)/RI  A/P    Post operative day 1 mono/di twins with LTCS.  Baby A (smaller twin) has transposition of the great arteries- has been transported to [facility].  Baby B in obs- not able to do circ today.  Routine post op and postpartum care.  Expect d/c in 2-3 days.  Percocet for pain control.

## 2012-06-17 LAB — CBC
Hemoglobin: 9.5 g/dL — ABNORMAL LOW (ref 12.0–15.0)
MCH: 31.7 pg (ref 26.0–34.0)
MCHC: 33.6 g/dL (ref 30.0–36.0)
Platelets: 184 10*3/uL (ref 150–400)
RDW: 14 % (ref 11.5–15.5)

## 2012-06-17 NOTE — Progress Notes (Signed)
  Patient is eating, ambulating, voiding.  Pain control is good.  Filed Vitals:   06/16/12 1800 06/16/12 2158 06/17/12 0147 06/17/12 0633  BP: 113/59 105/66 108/66 110/68  Pulse: 88 86 80 73  Temp: 99.8 F (37.7 C) 97.3 F (36.3 C) 97.6 F (36.4 C) 97.8 F (36.6 C)  TempSrc: Oral Oral Oral Oral  Resp: 18 18 18 18   Height:      Weight:      SpO2: 97% 95% 97%     lungs:   clear to auscultation cor:    RRR Abdomen:  soft, appropriate tenderness, incisions intact and without erythema or exudate ex:    no cords   Lab Results  Component Value Date   WBC 16.0* 06/17/2012   HGB 9.5* 06/17/2012   HCT 28.3* 06/17/2012   MCV 94.3 06/17/2012   PLT 184 06/17/2012    --/--/A POS, A POS (11/20 1230)/RI  A/P    Post operative day 2.  Twin B to have circumcision today;  Parents desires circumsision.  All risks, benefits and alternatives discussed with the mother.  Twin A is at [facility] for transposition of the great arteries.  Routine post op and postpartum care.  Expect d/c tomorrow; twin b for obs today.  Percocet for pain control.

## 2012-06-17 NOTE — Clinical Social Work Note (Signed)
CSW spoke with MOB.  MOB reports she does not have a hx of Adjustment d/o with depressed mood as indicated in consult for SW, and reports no hx of anxiety or depression in her past.  CSW discussed any emotional concerns especially with transfer of infant to [facility].  MOB did not report any emotional concerns at this time.  Please reconsult CSW  If further needs arise.  

## 2012-06-18 ENCOUNTER — Encounter (HOSPITAL_COMMUNITY)
Admission: RE | Admit: 2012-06-18 | Discharge: 2012-06-18 | Disposition: A | Discharge status: Home / Self Care | Source: Ambulatory Visit | Attending: Obstetrics and Gynecology | Admitting: Obstetrics and Gynecology

## 2012-06-18 DIAGNOSIS — O923 Agalactia: Secondary | ICD-10-CM | POA: Insufficient documentation

## 2012-06-18 MED ORDER — OXYCODONE-ACETAMINOPHEN 5-325 MG PO TABS: 1.0000 | ORAL_TABLET | ORAL | Status: DC | PRN

## 2012-06-18 NOTE — Progress Notes (Signed)
  Patient is eating, ambulating, voiding.  Pain control is good.  Filed Vitals:   06/17/12 1610 06/17/12 1450 06/17/12 2125 06/18/12 0527  BP: 110/68 91/55 97/59  106/65  Pulse: 73 76 94 71  Temp: 97.8 F (36.6 C) 97.8 F (36.6 C) 98.2 F (36.8 C) 97.7 F (36.5 C)  TempSrc: Oral Oral Oral Oral  Resp: 18 18 18 18   Height:      Weight:      SpO2:        lungs:   clear to auscultation cor:    RRR Abdomen:  soft, appropriate tenderness, incisions intact and without erythema or exudate ex:    no cords   Lab Results  Component Value Date   WBC 16.0* 06/17/2012   HGB 9.5* 06/17/2012   HCT 28.3* 06/17/2012   MCV 94.3 06/17/2012   PLT 184 06/17/2012    --/--/A POS, A POS (11/20 1230)/RI  A/P    Post operative day 3.  Routine post op and postpartum care.  Expect d/c today.  Percocet for pain control.

## 2012-06-19 ENCOUNTER — Encounter (HOSPITAL_COMMUNITY): Payer: Self-pay | Admitting: Obstetrics and Gynecology

## 2012-06-26 ENCOUNTER — Encounter (HOSPITAL_COMMUNITY): Payer: Self-pay

## 2012-07-19 ENCOUNTER — Encounter (HOSPITAL_COMMUNITY)
Admission: RE | Admit: 2012-07-19 | Discharge: 2012-07-19 | Disposition: A | Discharge status: Home / Self Care | Payer: BC Managed Care – PPO | Source: Ambulatory Visit | Attending: Obstetrics and Gynecology | Admitting: Obstetrics and Gynecology

## 2012-07-19 DIAGNOSIS — O923 Agalactia: Secondary | ICD-10-CM | POA: Insufficient documentation

## 2012-08-19 ENCOUNTER — Encounter (HOSPITAL_COMMUNITY)
Admission: RE | Admit: 2012-08-19 | Discharge: 2012-08-19 | Disposition: A | Discharge status: Home / Self Care | Payer: BC Managed Care – PPO | Source: Ambulatory Visit | Attending: Obstetrics and Gynecology | Admitting: Obstetrics and Gynecology

## 2012-08-19 DIAGNOSIS — O923 Agalactia: Secondary | ICD-10-CM | POA: Insufficient documentation

## 2012-09-18 ENCOUNTER — Ambulatory Visit (INDEPENDENT_AMBULATORY_CARE_PROVIDER_SITE_OTHER): Payer: BC Managed Care – PPO | Admitting: Family Medicine

## 2012-09-18 ENCOUNTER — Encounter: Payer: Self-pay | Admitting: Family Medicine

## 2012-09-18 VITALS — BP 94/60 | HR 88 | Temp 98.2°F | Wt 105.0 lb

## 2012-09-18 DIAGNOSIS — N61 Mastitis without abscess: Secondary | ICD-10-CM

## 2012-09-18 MED ORDER — CEPHALEXIN 500 MG PO CAPS: 500.0000 mg | ORAL_CAPSULE | Freq: Three times a day (TID) | ORAL | Status: AC

## 2012-09-18 NOTE — Progress Notes (Signed)
  Subjective:    Patient ID: Melinda Bell, female    DOB: Dec 24, 1985, 27 y.o.   MRN: [id number]  HPI Here for one week of redness, swelling, and mild pain in the right breast. She gave birth last November and is breast feeding. She feels like her breasts empty completely. She has a hx of a lump in the right breast which she classifies as "fibrocystic" and she had an [place] of this some years ago. I cannot find a report on this in her record. No fevers.    Review of Systems  Constitutional: Negative.        Objective:   Physical Exam  Constitutional: She appears well-developed and well-nourished.  Genitourinary:  The right breast has an area of erythema at the 12 o'clock position just above the nipple. This is tender and warm. There is a round mobile cystic lesion deep to this area. No axillary nodes. Milk can be expressed from the nipple but no blood.           Assessment & Plan:  This is early mastitis. Treat with Keflex and warm compresses. She can follow up on the cystic lesion with Dr. Fabian Sharp

## 2012-09-19 ENCOUNTER — Encounter (HOSPITAL_COMMUNITY): Payer: BC Managed Care – PPO | Attending: Obstetrics and Gynecology

## 2012-09-19 DIAGNOSIS — O923 Agalactia: Secondary | ICD-10-CM | POA: Insufficient documentation

## 2012-10-18 ENCOUNTER — Encounter (HOSPITAL_COMMUNITY)
Admission: RE | Admit: 2012-10-18 | Discharge: 2012-10-18 | Disposition: A | Discharge status: Home / Self Care | Payer: BC Managed Care – PPO | Source: Ambulatory Visit | Attending: Obstetrics and Gynecology | Admitting: Obstetrics and Gynecology

## 2012-10-18 DIAGNOSIS — O923 Agalactia: Secondary | ICD-10-CM | POA: Insufficient documentation

## 2012-11-18 ENCOUNTER — Encounter (HOSPITAL_COMMUNITY)
Admission: RE | Admit: 2012-11-18 | Discharge: 2012-11-18 | Disposition: A | Discharge status: Home / Self Care | Payer: BC Managed Care – PPO | Source: Ambulatory Visit | Attending: Obstetrics and Gynecology | Admitting: Obstetrics and Gynecology

## 2012-11-18 DIAGNOSIS — O923 Agalactia: Secondary | ICD-10-CM | POA: Insufficient documentation

## 2012-12-27 ENCOUNTER — Telehealth: Payer: Self-pay | Admitting: *Deleted

## 2012-12-27 MED ORDER — VALACYCLOVIR HCL 500 MG PO TABS: 500.0000 mg | ORAL_TABLET | Freq: Every day | ORAL | Status: DC

## 2012-12-27 NOTE — Telephone Encounter (Signed)
Pt has annual scheduled on 01/29/13, pt is requesting refill on valtrex 500 mg daily. rx sent will 0 refills.

## 2013-01-29 ENCOUNTER — Encounter: Payer: Self-pay | Admitting: Gynecology

## 2014-05-27 ENCOUNTER — Encounter: Payer: Self-pay | Admitting: Family Medicine

## 2014-05-29 IMAGING — US US OB LIMITED
1 series · 12 of 23 positions shown · non-contrast
Comparison: none

[Series 1: us ob limited · 0.23mm/px · 12 of 23 slices shown]
[im 1/23]
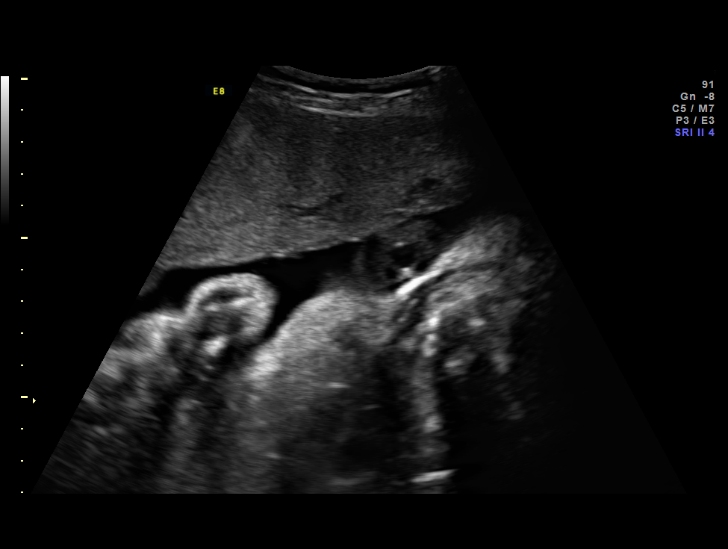
[im 3/23]
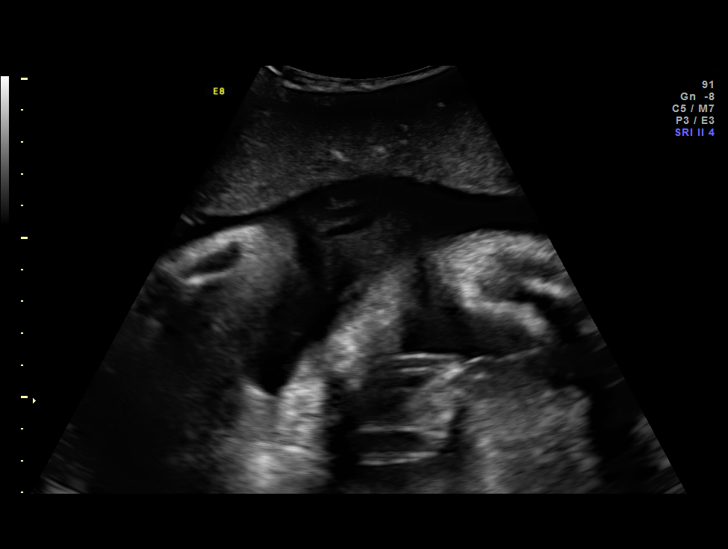
[im 5/23]
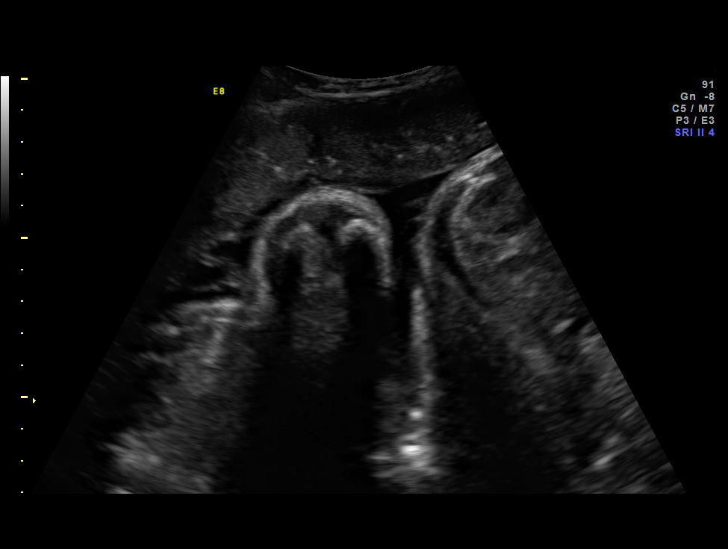
[im 7/23]
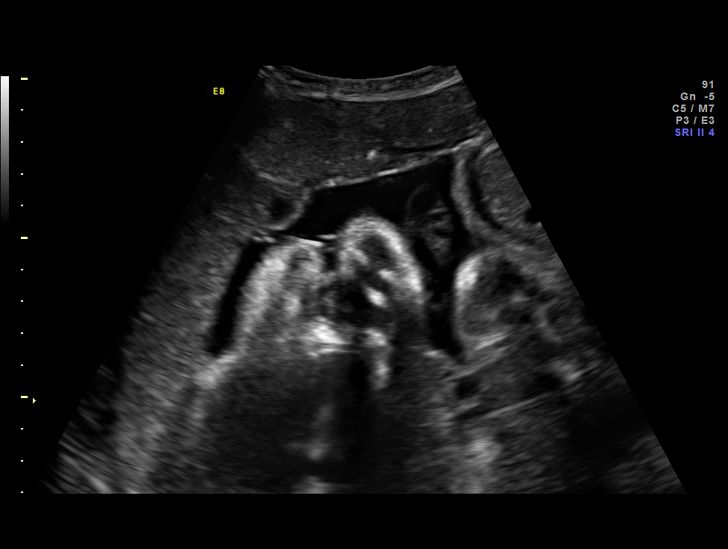
[im 9/23]
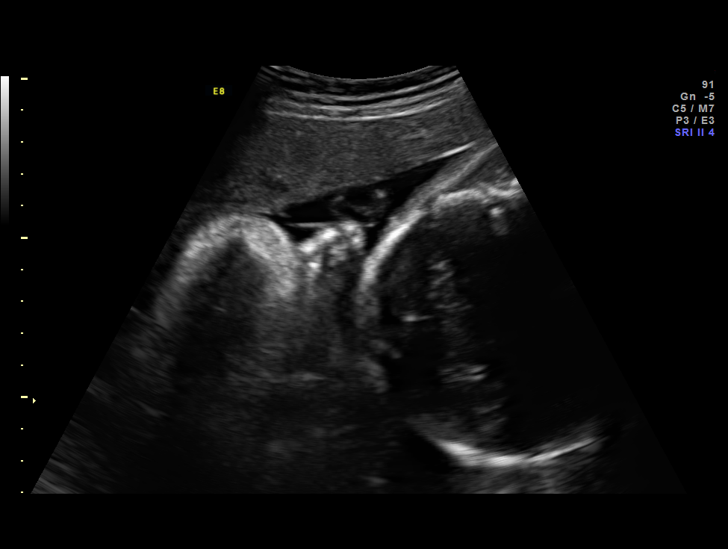
[im 11/23]
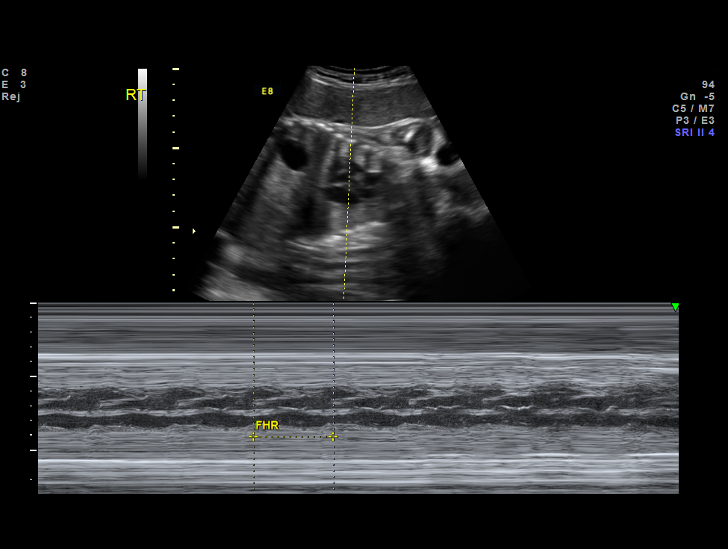
[im 13/23]
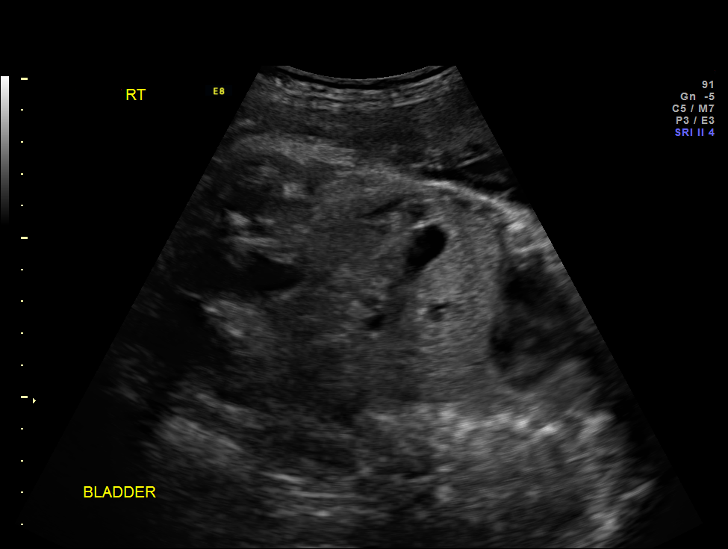
[im 15/23]
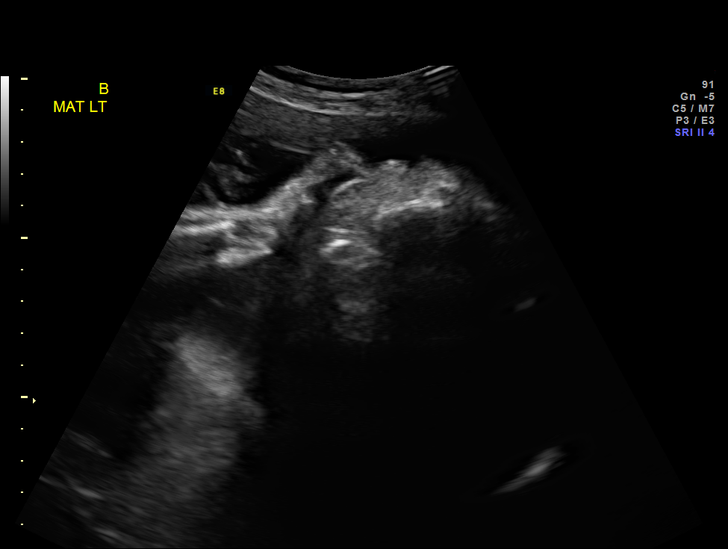
[im 17/23]
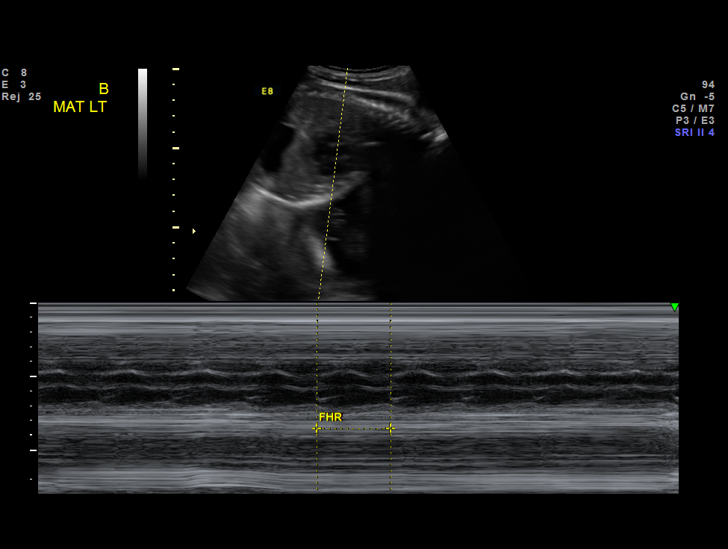
[im 19/23]
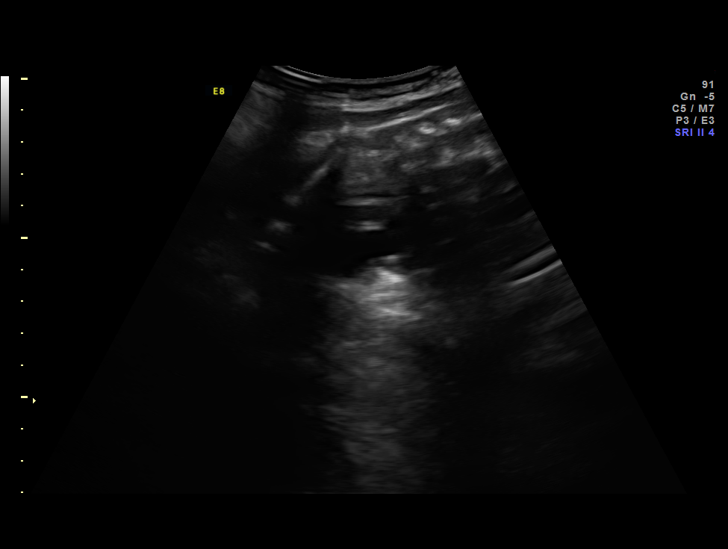
[im 21/23]
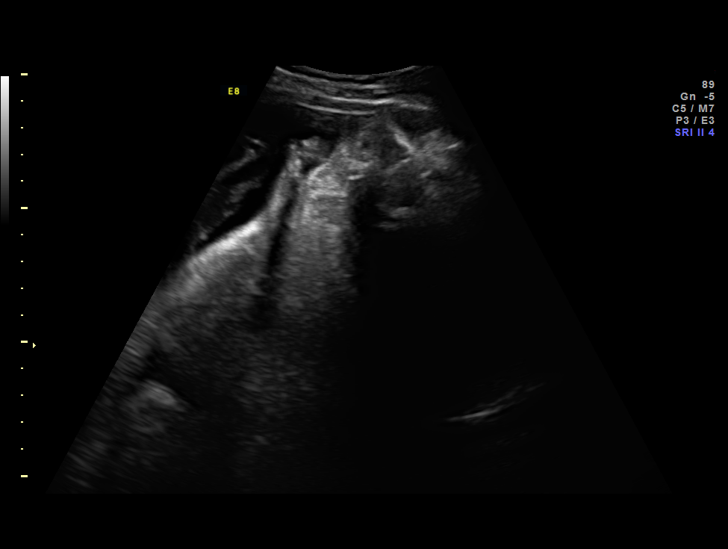
[im 23/23]
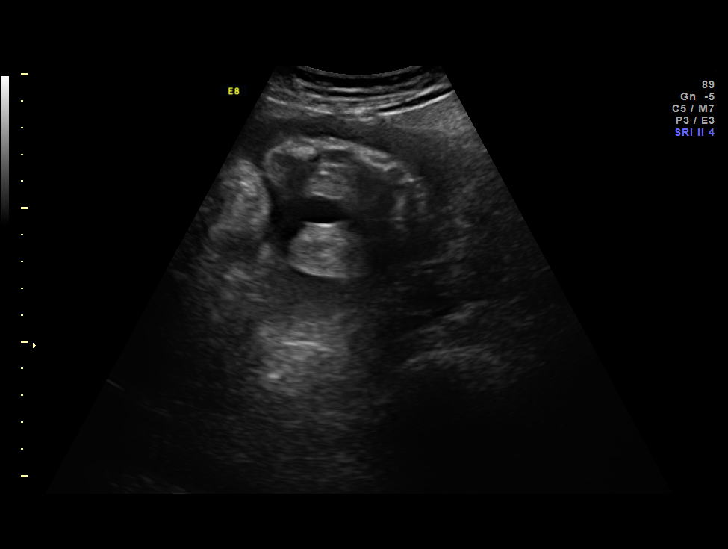

[12 of 23 positions shown; findings below may reference images not displayed]

OBSTETRICS REPORT
                      (Signed Final 05/17/2012 [DATE])

Service(s) Provided

 [HOSPITAL]                                         76815.0
Indications

 Twin gestation, Giovany Louis
 Assess amniotic fluid volume
 Herpes simplex virus (BRAHMBHATT)
Fetal Evaluation (Fetus A)

 Num Of Fetuses:    2
 Fetal Heart Rate:  126                          bpm
 Cardiac Activity:  Observed
 Fetal Lie:         Right Fetus
 Presentation:      Cephalic
 Placenta:          Anterior, above cervical os
 P. Cord            Previously Visualized
 Insertion:

 Membrane Desc:     Dividing
                    Membrane seen
                    - Monochorionic

 Amniotic Fluid
 AFI FV:      Subjectively within normal limits
                                             Larg Pckt:     3.7  cm
Gestational Age (Fetus A)

 LMP:           32w 5d        Date:  10/01/11                 EDD:   07/07/12
 Best:          32w 5d     Det. By:  LMP  (10/01/11)          EDD:   07/07/12
Anatomy (Fetus A)

 Cranium:          Previously seen        Ductal Arch:      Previously seen
 Fetal Cavum:      Previously seen        Diaphragm:        Previously seen
 Ventricles:       Appears normal         Stomach:          Previously Seen
 Choroid Plexus:   Previously seen        Abdomen:          Previously seen
 Cerebellum:       Previously seen        Abdominal Wall:   Previously seen
 Posterior Fossa:  Previously seen        Cord Vessels:     Previously seen
 Nuchal Fold:      Not applicable (>20    Kidneys:          Previously seen
                   wks GA)
 Face:             Orbits and profile     Bladder:          Previously seen
                   previously seen
 Lips:             Previously seen        Spine:            Previously seen
 Heart:            Previously seen        Lower             Previously seen
                                          Extremities:
 RVOT:             Previously seen        Upper             Previously seen
                                          Extremities:
 LVOT:             Previously seen        Limbs:            Previously seen
 Aortic Arch:      Previously seen

 Other:  Fetus appears to be a male. Heels and 5th digit previously seen.

Fetal Evaluation (Fetus B)

 Num Of Fetuses:    2
 Fetal Heart Rate:  136                          bpm
 Cardiac Activity:  Observed
 Fetal Lie:         Left Fetus
 Presentation:      Cephalic
 Placenta:          Anterior, above cervical os
 P. Cord            Previously Visualized
 Insertion:

 Membrane Desc:     Dividing
                    Membrane seen
                    - Monochorionic

 Amniotic Fluid
 AFI FV:      Subjectively within normal limits
                                             Larg Pckt:     4.7  cm
Gestational Age (Fetus B)

 LMP:           32w 5d        Date:  10/01/11                 EDD:   07/07/12
 Best:          32w 5d     Det. By:  LMP  (10/01/11)          EDD:   07/07/12
Anatomy (Fetus B)

 Cranium:          Previously seen        Ductal Arch:      Previously seen
 Fetal Cavum:      Previously seen        Diaphragm:        Previously seen
 Ventricles:       Previously seen        Stomach:          Previously Seen
 Choroid Plexus:   Previously seen        Abdomen:          Previously seen
 Cerebellum:       Previously seen        Abdominal Wall:   Previously seen
 Posterior Fossa:  Previously seen        Cord Vessels:     Previously seen
 Nuchal Fold:      Not applicable (>20    Kidneys:          Previously seen
                   wks GA)
 Face:             Previously seen        Bladder:          Previously seen
 Lips:             Previously seen        Spine:            Previously seen
 Heart:            Previously seen        Lower             Previously seen
                                          Extremities:
 RVOT:             Previously seen        Upper             Previously seen
                                          Extremities:
 LVOT:             Previously seen        Limbs:            Previously seen
 Aortic Arch:      Previously seen

 Other:  Fetus appears to be a male.  Heels previously seen.
Cervix Uterus Adnexa

 Cervix:       Not visualized (advanced GA >22wks)
Impression

 Monochorionic diamniotic intrauterine pregnancy at 32 weeks
 5 days.
 Normal amniotic fluid volume x2
 Bladders visible in both fetuses
 No evidence of twin-twin transfusion.
Recommendations

 Recommend follow up ultrasound in 2 weeks for interval
 growth.

## 2015-06-18 ENCOUNTER — Ambulatory Visit (INDEPENDENT_AMBULATORY_CARE_PROVIDER_SITE_OTHER): Admitting: Internal Medicine

## 2015-06-18 ENCOUNTER — Encounter: Payer: Self-pay | Admitting: Internal Medicine

## 2015-06-18 VITALS — BP 100/60 | Temp 97.8°F | Ht 63.5 in | Wt 114.5 lb

## 2015-06-18 DIAGNOSIS — L819 Disorder of pigmentation, unspecified: Secondary | ICD-10-CM | POA: Diagnosis not present

## 2015-06-18 DIAGNOSIS — Z0000 Encounter for general adult medical examination without abnormal findings: Secondary | ICD-10-CM | POA: Diagnosis not present

## 2015-06-18 DIAGNOSIS — Z3041 Encounter for surveillance of contraceptive pills: Secondary | ICD-10-CM

## 2015-06-18 DIAGNOSIS — Z79899 Other long term (current) drug therapy: Secondary | ICD-10-CM

## 2015-06-18 MED ORDER — VALACYCLOVIR HCL 500 MG PO TABS: 500.0000 mg | ORAL_TABLET | Freq: Every day | ORAL | Status: DC

## 2015-06-18 MED ORDER — NORETHINDRONE 0.35 MG PO TABS: 1.0000 | ORAL_TABLET | Freq: Every day | ORAL | Status: DC
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
## 2015-06-18 NOTE — Patient Instructions (Signed)
Can do tb test next week.  Get abd mole checked   [facility]  [address], [place], [place] [address] [phone] Is a good place for lesion evaluation   Health Maintenance, Female Adopting a healthy lifestyle and getting preventive care can go a long way to promote health and wellness. Talk with your health care provider about what schedule of regular examinations is right for you. This is a good chance for you to check in with your provider about disease prevention and staying healthy. In between checkups, there are plenty of things you can do on your own. Experts have done a lot of research about which lifestyle changes and preventive measures are most likely to keep you healthy. Ask your health care provider for more information. WEIGHT AND DIET  Eat a healthy diet  Be sure to include plenty of vegetables, fruits, low-fat dairy products, and lean protein.  Do not eat a lot of foods high in solid fats, added sugars, or salt.  Get regular exercise. This is one of the most important things you can do for your health.  Most adults should exercise for at least 150 minutes each week. The exercise should increase your heart rate and make you sweat (moderate-intensity exercise).  Most adults should also do strengthening exercises at least twice a week. This is in addition to the moderate-intensity exercise.  Maintain a healthy weight  Body mass index (BMI) is a measurement that can be used to identify possible weight problems. It estimates body fat based on height and weight. Your health care provider can help determine your BMI and help you achieve or maintain a healthy weight.  For females 54 years of age and older:   A BMI below 18.5 is considered underweight.  A BMI of 18.5 to 24.9 is normal.  A BMI of 25 to 29.9 is considered overweight.  A BMI of 30 and above is considered obese.  Watch levels of cholesterol and blood lipids  You should  start having your blood tested for lipids and cholesterol at 27 years of age, then have this test every 5 years.  You may need to have your cholesterol levels checked more often if:  Your lipid or cholesterol levels are high.  You are older than 27 years of age.  You are at high risk for heart disease.  CANCER SCREENING   Lung Cancer  Lung cancer screening is recommended for adults 51-59 years old who are at high risk for lung cancer because of a history of smoking.  A yearly low-dose CT scan of the lungs is recommended for people who:  Currently smoke.  Have quit within the past 15 years.  Have at least a 30-pack-year history of smoking. A pack year is smoking an average of one pack of cigarettes a day for 1 year.  Yearly screening should continue until it has been 15 years since you quit.  Yearly screening should stop if you develop a health problem that would prevent you from having lung cancer treatment.  Breast Cancer  Practice breast self-awareness. This means understanding how your breasts normally appear and feel.  It also means doing regular breast self-exams. Let your health care provider know about any changes, no matter how small.  If you are in your 20s or 30s, you should have a clinical breast exam (CBE) by a health care provider every 1-3 years as part of a regular health exam.  If you are 40 or  older, have a CBE every year. Also consider having a breast X-ray (mammogram) every year.  If you have a family history of breast cancer, talk to your health care provider about genetic screening.  If you are at high risk for breast cancer, talk to your health care provider about having an MRI and a mammogram every year.  Breast cancer gene (BRCA) assessment is recommended for women who have family members with BRCA-related cancers. BRCA-related cancers include:  Breast.  Ovarian.  Tubal.  Peritoneal cancers.  Results of the assessment will determine the  need for genetic counseling and BRCA1 and BRCA2 testing. Cervical Cancer Your health care provider may recommend that you be screened regularly for cancer of the pelvic organs (ovaries, uterus, and vagina). This screening involves a pelvic examination, including checking for microscopic changes to the surface of your cervix (Pap test). You may be encouraged to have this screening done every 3 years, beginning at age 79.  For women ages 9-65, health care providers may recommend pelvic exams and Pap testing every 3 years, or they may recommend the Pap and pelvic exam, combined with testing for human papilloma virus (HPV), every 5 years. Some types of HPV increase your risk of cervical cancer. Testing for HPV may also be done on women of any age with unclear Pap test results.  Other health care providers may not recommend any screening for nonpregnant women who are considered low risk for pelvic cancer and who do not have symptoms. Ask your health care provider if a screening pelvic exam is right for you.  If you have had past treatment for cervical cancer or a condition that could lead to cancer, you need Pap tests and screening for cancer for at least 20 years after your treatment. If Pap tests have been discontinued, your risk factors (such as having a new sexual partner) need to be reassessed to determine if screening should resume. Some women have medical problems that increase the chance of getting cervical cancer. In these cases, your health care provider may recommend more frequent screening and Pap tests. Colorectal Cancer  This type of cancer can be detected and often prevented.  Routine colorectal cancer screening usually begins at 27 years of age and continues through 27 years of age.  Your health care provider may recommend screening at an earlier age if you have risk factors for colon cancer.  Your health care provider may also recommend using home test kits to check for hidden blood in  the stool.  A small camera at the end of a tube can be used to examine your colon directly (sigmoidoscopy or colonoscopy). This is done to check for the earliest forms of colorectal cancer.  Routine screening usually begins at age 12.  Direct examination of the colon should be repeated every 5-10 years through 27 years of age. However, you may need to be screened more often if early forms of precancerous polyps or small growths are found. Skin Cancer  Check your skin from head to toe regularly.  Tell your health care provider about any new moles or changes in moles, especially if there is a change in a mole's shape or color.  Also tell your health care provider if you have a mole that is larger than the size of a pencil eraser.  Always use sunscreen. Apply sunscreen liberally and repeatedly throughout the day.  Protect yourself by wearing long sleeves, pants, a wide-brimmed hat, and sunglasses whenever you are outside. HEART DISEASE, DIABETES,  AND HIGH BLOOD PRESSURE   High blood pressure causes heart disease and increases the risk of stroke. High blood pressure is more likely to develop in:  People who have blood pressure in the high end of the normal range (130-139/85-89 mm Hg).  People who are overweight or obese.  People who are African American.  If you are 18-39 years of age, have your blood pressure checked every 3-5 years. If you are 40 years of age or older, have your blood pressure checked every year. You should have your blood pressure measured twice--once when you are at a hospital or clinic, and once when you are not at a hospital or clinic. Record the average of the two measurements. To check your blood pressure when you are not at a hospital or clinic, you can use:  An automated blood pressure machine at a pharmacy.  A home blood pressure monitor.  If you are between 55 years and 79 years old, ask your health care provider if you should take aspirin to prevent  strokes.  Have regular diabetes screenings. This involves taking a blood sample to check your fasting blood sugar level.  If you are at a normal weight and have a low risk for diabetes, have this test once every three years after 27 years of age.  If you are overweight and have a high risk for diabetes, consider being tested at a younger age or more often. PREVENTING INFECTION  Hepatitis B  If you have a higher risk for hepatitis B, you should be screened for this virus. You are considered at high risk for hepatitis B if:  You were born in a country where hepatitis B is common. Ask your health care provider which countries are considered high risk.  Your parents were born in a high-risk country, and you have not been immunized against hepatitis B (hepatitis B vaccine).  You have HIV or AIDS.  You use needles to inject street drugs.  You live with someone who has hepatitis B.  You have had sex with someone who has hepatitis B.  You get hemodialysis treatment.  You take certain medicines for conditions, including cancer, organ transplantation, and autoimmune conditions. Hepatitis C  Blood testing is recommended for:  Everyone born from 1945 through 1965.  Anyone with known risk factors for hepatitis C. Sexually transmitted infections (STIs)  You should be screened for sexually transmitted infections (STIs) including gonorrhea and chlamydia if:  You are sexually active and are younger than 27 years of age.  You are older than 27 years of age and your health care provider tells you that you are at risk for this type of infection.  Your sexual activity has changed since you were last screened and you are at an increased risk for chlamydia or gonorrhea. Ask your health care provider if you are at risk.  If you do not have HIV, but are at risk, it may be recommended that you take a prescription medicine daily to prevent HIV infection. This is called pre-exposure prophylaxis  (PrEP). You are considered at risk if:  You are sexually active and do not regularly use condoms or know the HIV status of your partner(s).  You take drugs by injection.  You are sexually active with a partner who has HIV. Talk with your health care provider about whether you are at high risk of being infected with HIV. If you choose to begin PrEP, you should first be tested for HIV. You should then be   tested every 3 months for as long as you are taking PrEP.  PREGNANCY   If you are premenopausal and you may become pregnant, ask your health care provider about preconception counseling.  If you may become pregnant, take 400 to 800 micrograms (mcg) of folic acid every day.  If you want to prevent pregnancy, talk to your health care provider about birth control (contraception). OSTEOPOROSIS AND MENOPAUSE   Osteoporosis is a disease in which the bones lose minerals and strength with aging. This can result in serious bone fractures. Your risk for osteoporosis can be identified using a bone density scan.  If you are 43 years of age or older, or if you are at risk for osteoporosis and fractures, ask your health care provider if you should be screened.  Ask your health care provider whether you should take a calcium or vitamin D supplement to lower your risk for osteoporosis.  Menopause may have certain physical symptoms and risks.  Hormone replacement therapy may reduce some of these symptoms and risks. Talk to your health care provider about whether hormone replacement therapy is right for you.  HOME CARE INSTRUCTIONS   Schedule regular health, dental, and eye exams.  Stay current with your immunizations.   Do not use any tobacco products including cigarettes, chewing tobacco, or electronic cigarettes.  If you are pregnant, do not drink alcohol.  If you are breastfeeding, limit how much and how often you drink alcohol.  Limit alcohol intake to no more than 1 drink per day for  nonpregnant women. One drink equals 12 ounces of beer, 5 ounces of wine, or 1 ounces of hard liquor.  Do not use street drugs.  Do not share needles.  Ask your health care provider for help if you need support or information about quitting drugs.  Tell your health care provider if you often feel depressed.  Tell your health care provider if you have ever been abused or do not feel safe at home.   This information is not intended to replace advice given to you by your health care provider. Make sure you discuss any questions you have with your health care provider.   Document Released: 01/25/2011 Document Revised: 08/02/2014 Document Reviewed: 06/13/2013 Elsevier Interactive Patient Education [employer].

## 2015-06-23 ENCOUNTER — Ambulatory Visit: Admitting: Family Medicine

## 2015-06-24 ENCOUNTER — Ambulatory Visit (INDEPENDENT_AMBULATORY_CARE_PROVIDER_SITE_OTHER): Admitting: Family Medicine

## 2015-06-24 DIAGNOSIS — Z111 Encounter for screening for respiratory tuberculosis: Secondary | ICD-10-CM

## 2015-06-27 LAB — TB SKIN TEST: TB SKIN TEST: NEGATIVE
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
## 2015-10-02 ENCOUNTER — Encounter: Payer: Self-pay | Admitting: Internal Medicine

## 2015-10-02 ENCOUNTER — Ambulatory Visit (INDEPENDENT_AMBULATORY_CARE_PROVIDER_SITE_OTHER): Admitting: Internal Medicine

## 2015-10-02 VITALS — BP 116/66 | Temp 98.3°F | Wt 114.0 lb

## 2015-10-02 DIAGNOSIS — H6983 Other specified disorders of Eustachian tube, bilateral: Secondary | ICD-10-CM | POA: Diagnosis not present

## 2015-10-02 DIAGNOSIS — Z01118 Encounter for examination of ears and hearing with other abnormal findings: Secondary | ICD-10-CM | POA: Insufficient documentation

## 2015-10-02 DIAGNOSIS — J069 Acute upper respiratory infection, unspecified: Secondary | ICD-10-CM | POA: Diagnosis not present

## 2015-10-02 DIAGNOSIS — H9390 Unspecified disorder of ear, unspecified ear: Secondary | ICD-10-CM

## 2015-10-02 DIAGNOSIS — J018 Other acute sinusitis: Secondary | ICD-10-CM | POA: Diagnosis not present

## 2015-10-02 MED ORDER — DOXYCYCLINE HYCLATE 100 MG PO TABS: 100.0000 mg | ORAL_TABLET | Freq: Two times a day (BID) | ORAL | Status: DC

## 2015-10-02 NOTE — Patient Instructions (Signed)
Add flonase or nasacort  2 sprays every day for at least 2 weeks   If not improving in the next 5 days or so can add antibiotic and stay on the nose spray. Saline  Nose spray can  alos help.   Will be contacted about getting ent to check your ear exam .  Chest exam is good today  This was probably a viral resp infection with continued  Inflammation .

## 2016-04-14 ENCOUNTER — Other Ambulatory Visit: Payer: Self-pay | Admitting: Internal Medicine

## 2016-04-16 ENCOUNTER — Telehealth: Payer: Self-pay | Admitting: Family Medicine

## 2016-04-16 ENCOUNTER — Other Ambulatory Visit: Payer: Self-pay | Admitting: Family Medicine

## 2016-04-16 DIAGNOSIS — Z0000 Encounter for general adult medical examination without abnormal findings: Secondary | ICD-10-CM

## 2016-04-16 NOTE — Telephone Encounter (Signed)
Sent to the pharmacy by e-scribe for 2 months.  Pt due for cpx in Nov 2017.  Message sent to scheduling.

## 2016-04-16 NOTE — Telephone Encounter (Signed)
Pt due for cpx and lab work in Nov 2017.  Please help her to make both appointments.  I have placed the lab orders.  Thanks!!

## 2016-04-19 NOTE — Telephone Encounter (Signed)
Pt needs to check her schedule and will callback

## 2016-06-11 ENCOUNTER — Other Ambulatory Visit: Payer: Self-pay | Admitting: Internal Medicine

## 2016-06-14 NOTE — Telephone Encounter (Signed)
Left a message for the pt to return my call.  Now due for cpx and lab work.  Did reach the pt in the past and she stated that she would call back but appt scheduled as of yet.

## 2016-06-16 NOTE — Telephone Encounter (Signed)
Sent to the pharmacy by e-scribe for 2 months.  I scheduled pt to come fasting for cpx on 08/03/16 @ 8:45.
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
## 2016-08-03 ENCOUNTER — Ambulatory Visit: Admitting: Internal Medicine
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
## 2016-08-17 ENCOUNTER — Other Ambulatory Visit (HOSPITAL_COMMUNITY)
Admission: RE | Admit: 2016-08-17 | Discharge: 2016-08-17 | Disposition: A | Discharge status: Home / Self Care | Payer: Self-pay | Source: Ambulatory Visit | Attending: Internal Medicine | Admitting: Internal Medicine

## 2016-08-17 ENCOUNTER — Ambulatory Visit (INDEPENDENT_AMBULATORY_CARE_PROVIDER_SITE_OTHER): Payer: Self-pay | Admitting: Internal Medicine

## 2016-08-17 ENCOUNTER — Encounter: Payer: Self-pay | Admitting: Internal Medicine

## 2016-08-17 VITALS — BP 104/62 | Temp 98.1°F | Ht 64.0 in | Wt 113.0 lb

## 2016-08-17 DIAGNOSIS — Z0000 Encounter for general adult medical examination without abnormal findings: Secondary | ICD-10-CM

## 2016-08-17 DIAGNOSIS — L709 Acne, unspecified: Secondary | ICD-10-CM

## 2016-08-17 DIAGNOSIS — Z01419 Encounter for gynecological examination (general) (routine) without abnormal findings: Secondary | ICD-10-CM | POA: Insufficient documentation

## 2016-08-17 DIAGNOSIS — Z79899 Other long term (current) drug therapy: Secondary | ICD-10-CM

## 2016-08-17 DIAGNOSIS — Z3041 Encounter for surveillance of contraceptive pills: Secondary | ICD-10-CM

## 2016-08-17 LAB — CBC WITH DIFFERENTIAL/PLATELET
BASOS PCT: 0.6 % (ref 0.0–3.0)
Basophils Absolute: 0 10*3/uL (ref 0.0–0.1)
EOS PCT: 2.1 % (ref 0.0–5.0)
Eosinophils Absolute: 0.2 10*3/uL (ref 0.0–0.7)
HCT: 42.3 % (ref 36.0–46.0)
HEMOGLOBIN: 14.3 g/dL (ref 12.0–15.0)
LYMPHS ABS: 1.9 10*3/uL (ref 0.7–4.0)
Lymphocytes Relative: 26.7 % (ref 12.0–46.0)
MCHC: 33.8 g/dL (ref 30.0–36.0)
MCV: 94.5 fl (ref 78.0–100.0)
MONO ABS: 0.4 10*3/uL (ref 0.1–1.0)
Monocytes Relative: 5.1 % (ref 3.0–12.0)
NEUTROS PCT: 65.5 % (ref 43.0–77.0)
Neutro Abs: 4.7 10*3/uL (ref 1.4–7.7)
Platelets: 394 10*3/uL (ref 150.0–400.0)
RBC: 4.48 Mil/uL (ref 3.87–5.11)
RDW: 12.9 % (ref 11.5–15.5)
WBC: 7.1 10*3/uL (ref 4.0–10.5)

## 2016-08-17 LAB — LIPID PANEL
CHOLESTEROL: 209 mg/dL — AB (ref 0–200)
HDL: 52.8 mg/dL (ref >39.00)
LDL Cholesterol: 145 mg/dL — ABNORMAL HIGH (ref 0–99)
NonHDL: 155.95
TRIGLYCERIDES: 55 mg/dL (ref 0.0–149.0)
Total CHOL/HDL Ratio: 4
VLDL: 11 mg/dL (ref 0.0–40.0)

## 2016-08-17 LAB — HEPATIC FUNCTION PANEL
ALT: 8 U/L (ref 0–35)
AST: 14 U/L (ref 0–37)
Albumin: 4.6 g/dL (ref 3.5–5.2)
Alkaline Phosphatase: 62 U/L (ref 39–117)
BILIRUBIN TOTAL: 0.8 mg/dL (ref 0.2–1.2)
Bilirubin, Direct: 0.1 mg/dL (ref 0.0–0.3)
Total Protein: 7.3 g/dL (ref 6.0–8.3)

## 2016-08-17 LAB — BASIC METABOLIC PANEL
BUN: 11 mg/dL (ref 6–23)
CHLORIDE: 101 meq/L (ref 96–112)
CO2: 28 meq/L (ref 19–32)
Calcium: 9.7 mg/dL (ref 8.4–10.5)
Creatinine, Ser: 0.71 mg/dL (ref 0.40–1.20)
GFR: 103.66 mL/min (ref >60.00)
GLUCOSE: 72 mg/dL (ref 70–99)
POTASSIUM: 4 meq/L (ref 3.5–5.1)
Sodium: 136 mEq/L (ref 135–145)

## 2016-08-17 LAB — TSH: TSH: 1.6 u[IU]/mL (ref 0.35–4.50)

## 2016-08-17 MED ORDER — VALACYCLOVIR HCL 500 MG PO TABS: 500.0000 mg | ORAL_TABLET | Freq: Every day | ORAL | 12 refills | Status: DC

## 2016-08-17 MED ORDER — NORGESTIMATE-ETH ESTRADIOL 0.25-35 MG-MCG PO TABS: 1.0000 | ORAL_TABLET | Freq: Every day | ORAL | 11 refills | Status: DC

## 2016-08-17 NOTE — Patient Instructions (Signed)
Change oral contraceptive pill to estrogen-based. Since she will be on estrogen sometimes when you first start you can get breast tenderness and nausea. Her bleeding should get more regular and hopefully acne improvement also. Let you know when Pap results are back. If after 3 months on the new pill her bleeding pattern is unacceptable for increasing headaches or concerns, back and will readjust her medicine.   Will notify you  of labs when available.

## 2016-08-18 LAB — CYTOLOGY - PAP: DIAGNOSIS: NEGATIVE

## 2016-08-19 NOTE — Progress Notes (Signed)
Tell patient PAP is normal.
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
## 2016-10-12 ENCOUNTER — Ambulatory Visit (INDEPENDENT_AMBULATORY_CARE_PROVIDER_SITE_OTHER): Payer: Self-pay | Admitting: Internal Medicine

## 2016-10-12 ENCOUNTER — Encounter: Payer: Self-pay | Admitting: Internal Medicine

## 2016-10-12 VITALS — BP 90/60 | HR 75 | Temp 98.2°F | Ht 64.0 in | Wt 109.4 lb

## 2016-10-12 DIAGNOSIS — R42 Dizziness and giddiness: Secondary | ICD-10-CM

## 2016-10-12 DIAGNOSIS — H8110 Benign paroxysmal vertigo, unspecified ear: Secondary | ICD-10-CM

## 2016-10-12 NOTE — Patient Instructions (Addendum)
This sounds like positional vertigo  Go on  Your allegra and  flonase  Or inhaled nasal cortisone .  evey day . Allergy and congestion could cause this   puttin in referral to pt for .   Your ears do not looked infected .    If needed can add motion sickness medicine such as  Bonine or mecliZine but this can make you drowsy . And doesn't cure the problem .     Benign Positional Vertigo Vertigo is the feeling that you or your surroundings are moving when they are not. Benign positional vertigo is the most common form of vertigo. The cause of this condition is not serious (is benign). This condition is triggered by certain movements and positions (is positional). This condition can be dangerous if it occurs while you are doing something that could endanger you or others, such as driving. What are the causes? In many cases, the cause of this condition is not known. It may be caused by a disturbance in an area of the inner ear that helps your brain to sense movement and balance. This disturbance can be caused by a viral infection (labyrinthitis), head injury, or repetitive motion. What increases the risk? This condition is more likely to develop in:  Women.  People who are 29 years of age or older. What are the signs or symptoms? Symptoms of this condition usually happen when you move your head or your eyes in different directions. Symptoms may start suddenly, and they usually last for less than a minute. Symptoms may include:  Loss of balance and falling.  Feeling like you are spinning or moving.  Feeling like your surroundings are spinning or moving.  Nausea and vomiting.  Blurred vision.  Dizziness.  Involuntary eye movement (nystagmus). Symptoms can be mild and cause only slight annoyance, or they can be severe and interfere with daily life. Episodes of benign positional vertigo may return (recur) over time, and they may be triggered by certain movements. Symptoms may improve over  time. How is this diagnosed? This condition is usually diagnosed by medical history and a physical exam of the head, neck, and ears. You may be referred to a health care provider who specializes in ear, nose, and throat (ENT) problems (otolaryngologist) or a provider who specializes in disorders of the nervous system (neurologist). You may have additional testing, including:  MRI.  A CT scan.  Eye movement tests. Your health care provider may ask you to change positions quickly while he or she watches you for symptoms of benign positional vertigo, such as nystagmus. Eye movement may be tested with an electronystagmogram (ENG), caloric stimulation, the Dix-Hallpike test, or the roll test.  An electroencephalogram (EEG). This records electrical activity in your brain.  Hearing tests. How is this treated? Usually, your health care provider will treat this by moving your head in specific positions to adjust your inner ear back to normal. Surgery may be needed in severe cases, but this is rare. In some cases, benign positional vertigo may resolve on its own in 2-4 weeks. Follow these instructions at home: Safety   Move slowly.Avoid sudden body or head movements.  Avoid driving.  Avoid operating heavy machinery.  Avoid doing any tasks that would be dangerous to you or others if a vertigo episode would occur.  If you have trouble walking or keeping your balance, try using a cane for stability. If you feel dizzy or unstable, sit down right away.  Return to your normal  activities as told by your health care provider. Ask your health care provider what activities are safe for you. General instructions   Take over-the-counter and prescription medicines only as told by your health care provider.  Avoid certain positions or movements as told by your health care provider.  Drink enough fluid to keep your urine clear or pale yellow.  Keep all follow-up visits as told by your health care  provider. This is important. Contact a health care provider if:  You have a fever.  Your condition gets worse or you develop new symptoms.  Your family or friends notice any behavioral changes.  Your nausea or vomiting gets worse.  You have numbness or a "pins and needles" sensation. Get help right away if:  You have difficulty speaking or moving.  You are always dizzy.  You faint.  You develop severe headaches.  You have weakness in your legs or arms.  You have changes in your hearing or vision.  You develop a stiff neck.  You develop sensitivity to light. This information is not intended to replace advice given to you by your health care provider. Make sure you discuss any questions you have with your health care provider. Document Released: 04/19/2006 Document Revised: 12/18/2015 Document Reviewed: 11/04/2014 Elsevier Interactive Patient Education  2017 [employer].

## 2017-03-01 ENCOUNTER — Ambulatory Visit (INDEPENDENT_AMBULATORY_CARE_PROVIDER_SITE_OTHER): Payer: PRIVATE HEALTH INSURANCE | Admitting: Internal Medicine

## 2017-03-01 ENCOUNTER — Encounter: Payer: Self-pay | Admitting: Internal Medicine

## 2017-03-01 VITALS — BP 100/62 | HR 72 | Temp 98.4°F | Wt 112.6 lb

## 2017-03-01 DIAGNOSIS — G43009 Migraine without aura, not intractable, without status migrainosus: Secondary | ICD-10-CM

## 2017-03-01 DIAGNOSIS — R51 Headache: Secondary | ICD-10-CM

## 2017-03-01 DIAGNOSIS — R519 Headache, unspecified: Secondary | ICD-10-CM

## 2017-03-01 MED ORDER — SUMATRIPTAN SUCCINATE 100 MG PO TABS: ORAL_TABLET | ORAL | 1 refills | Status: DC

## 2017-03-01 MED ORDER — KETOROLAC TROMETHAMINE 10 MG PO TABS: ORAL_TABLET | ORAL | 0 refills | Status: DC

## 2017-03-01 NOTE — Patient Instructions (Addendum)
At the onset of headache take 2 aleve  and  The imitrex I fyou feel going to be a major headache   Can use  imitrex  As needed for migraine but no more than 2 days per week  .   Calendar  Ha to help with triggers and prevention.  include relation to periods and medications sleep etc    Recurrent Migraine Headache Migraines are a type of headache, and they are usually stronger and more sudden than normal headaches (tension headaches). Migraines are characterized by an intense pulsing, throbbing pain that is usually only present on one side of the head. Sometimes, migraine headaches can cause nausea, vomiting, sensitivity to light and sound, and vision changes. Recurrent migraines keep coming back (recurring). A migraine can last from 4 hours up to 3 days. What are the causes? The exact cause of this condition is not known. However, a migraine may be caused when nerves in the brain become irritated and release chemicals that cause inflammation of blood vessels. This inflammation causes pain. Certain things may also trigger migraines, such as:  A disruption in your regular eating and sleeping schedule.  Smoking.  Stress.  Menstruation.  Certain foods and drinks, such as: ? Aged cheese. ? Chocolate. ? Alcohol. ? Caffeine. ? Foods or drinks that contain nitrates, glutamate, aspartame, MSG, or tyramine.  Lack of sleep.  Hunger.  Physical exertion.  Fatigue.  High altitude.  Weather changes.  Medicines, such as: ? Nitroglycerin, which is used to treat chest pain. ? Birth control pills. ? Estrogen. ? Some blood pressure medicines.  What are the signs or symptoms? Symptoms of this condition vary for each person and may include:  Pain that is usually only present on one side of the head. In some cases, the pain may be on both sides of the head or around the head or neck.  Pulsating or throbbing pain.  Severe pain that prevents daily activities.  Pain that is aggravated  by any physical activity.  Nausea, vomiting, or both.  Dizziness.  Pain with exposure to bright lights, loud noises, or activity.  General sensitivity to bright lights, loud noises, or smells.  Before you get a migraine, you may get warning signs that a migraine is coming (aura). An aura may include:  Seeing flashing lights.  Seeing bright spots, halos, or zigzag lines.  Having tunnel vision or blurred vision.  Having numbness or a tingling feeling.  Having trouble talking.  Having muscle weakness.  Smelling a certain odor.  How is this diagnosed? This condition is often diagnosed based on:  Your symptoms and medical history.  A physical exam.  You may also have tests, including:  A CT scan or MRI of your brain. These imaging tests cannot diagnose migraines, but they can help to rule out other causes of headaches.  Blood tests.  How is this treated? This condition is treated with:  Medicines. These are used for: ? Lessening pain and nausea. ? Preventing recurrent migraines.  Lifestyle changes, such as changes to your diet or sleeping patterns.  Behavior therapy, such as relaxation training or biofeedback. Biofeedback is a treatment that involves teaching you to relax and use your brain to lower your heart rate and control your breathing.  Follow these instructions at home: Medicines  Take over-the-counter and prescription medicines only as told by your health care provider.  Do not drive or use heavy machinery while taking prescription pain medicine. Lifestyle  Do not use any products  that contain nicotine or tobacco, such as cigarettes and e-cigarettes. If you need help quitting, ask your health care provider.  Limit alcohol intake to no more than 1 drink a day for nonpregnant women and 2 drinks a day for men. One drink equals 12 oz of beer, 5 oz of wine, or 1 oz of hard liquor.  Get 7-9 hours of sleep each night, or the amount of sleep recommended by  your health care provider.  Limit your stress. Talk with your health care provider if you need help with stress management.  Maintain a healthy weight. If you need help losing weight, ask your health care provider.  Exercise regularly. Aim for 150 minutes of moderate-intensity exercise (walking, biking, yoga) or 75 minutes of vigorous exercise (running, circuit training, swimming) each week. General instructions  Keep a journal to find out what triggers your migraine headaches so you can avoid these triggers. For example, write down: ? What you eat and drink. ? How much sleep you get. ? Any change to your diet or medicines.  Lie down in a dark, quiet room when you have a migraine.  Try placing a cool towel over your head when you have a migraine.  Keep lights dim, if bright lights bother you and make your migraines worse.  Keep all follow-up visits as told by your health care provider. This is important. Contact a health care provider if:  Your pain does not improve, even with medicine.  Your migraines continue to return, even with medicine.  You have a fever.  You have weight loss. Get help right away if:  Your migraine becomes severe and medicine does not help.  You have a stiff neck.  You have a loss of vision.  You have muscle weakness or loss of muscle control.  You start losing your balance or have trouble walking.  You feel faint or you pass out.  You develop new, severe symptoms.  You start having abrupt severe headaches that last for a second or less, like a thunderclap. Summary  Migraine headaches are usually stronger and more sudden than normal headaches (tension headaches). Migraines are characterized by an intense pulsing, throbbing pain that is usually only present on one side of the head.  The exact cause of this condition is not known. However, a migraine may be caused when nerves in the brain become irritated and release chemicals that cause  inflammation of blood vessels.  Certain things may trigger migraines, such as changes to diet or sleeping patterns, smoking, certain foods, alcohol, stress, and certain medicines.  Sometimes, migraine headaches can cause nausea, vomiting, sensitivity to light and sound, and vision changes.  Migraines are often diagnosed based on your symptoms, medical history, and a physical exam. This information is not intended to replace advice given to you by your health care provider. Make sure you discuss any questions you have with your health care provider. Document Released: 04/06/2001 Document Revised: 04/23/2016 Document Reviewed: 04/23/2016 Elsevier Interactive Patient Education  [employer].
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
## 2017-04-15 ENCOUNTER — Encounter: Payer: Self-pay | Admitting: Internal Medicine
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
## 2017-05-02 ENCOUNTER — Ambulatory Visit (INDEPENDENT_AMBULATORY_CARE_PROVIDER_SITE_OTHER): Payer: PRIVATE HEALTH INSURANCE | Admitting: Internal Medicine

## 2017-05-02 ENCOUNTER — Encounter: Payer: Self-pay | Admitting: Internal Medicine

## 2017-05-02 VITALS — BP 102/70 | HR 82 | Temp 98.0°F | Resp 16 | Ht 64.0 in | Wt 112.4 lb

## 2017-05-02 DIAGNOSIS — T63421A Toxic effect of venom of ants, accidental (unintentional), initial encounter: Secondary | ICD-10-CM

## 2017-05-02 DIAGNOSIS — G43009 Migraine without aura, not intractable, without status migrainosus: Secondary | ICD-10-CM | POA: Diagnosis not present

## 2017-05-02 DIAGNOSIS — R51 Headache: Secondary | ICD-10-CM | POA: Diagnosis not present

## 2017-05-02 DIAGNOSIS — Z79899 Other long term (current) drug therapy: Secondary | ICD-10-CM | POA: Diagnosis not present

## 2017-05-02 DIAGNOSIS — R519 Headache, unspecified: Secondary | ICD-10-CM

## 2017-05-02 MED ORDER — AMITRIPTYLINE HCL 10 MG PO TABS: 10.0000 mg | ORAL_TABLET | Freq: Every day | ORAL | 1 refills | Status: DC

## 2017-05-02 NOTE — Patient Instructions (Addendum)
Continue to track your headaches . And medication.  Ok to use imitrex  As needed   Trial of preventive   amitriptyline 10 mg at night   For a month with consideration of increasing to 20 mg per night .   The anti bite  Elevated  And can try antihistamine and topical cortisone  .  Usually subsides over days     . If getting systemic allergic reaction   Reevaluate.     ROV in 3 months or earlier if needed
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
## 2017-08-02 ENCOUNTER — Encounter: Payer: Self-pay | Admitting: Internal Medicine

## 2017-08-02 ENCOUNTER — Ambulatory Visit: Payer: PRIVATE HEALTH INSURANCE | Admitting: Internal Medicine

## 2017-08-02 VITALS — BP 98/62 | HR 59 | Temp 98.0°F | Wt 118.2 lb

## 2017-08-02 DIAGNOSIS — Z3041 Encounter for surveillance of contraceptive pills: Secondary | ICD-10-CM

## 2017-08-02 DIAGNOSIS — R519 Headache, unspecified: Secondary | ICD-10-CM

## 2017-08-02 DIAGNOSIS — G43009 Migraine without aura, not intractable, without status migrainosus: Secondary | ICD-10-CM

## 2017-08-02 DIAGNOSIS — Z79899 Other long term (current) drug therapy: Secondary | ICD-10-CM

## 2017-08-02 DIAGNOSIS — R51 Headache: Secondary | ICD-10-CM

## 2017-08-02 MED ORDER — SUMATRIPTAN SUCCINATE 100 MG PO TABS: ORAL_TABLET | ORAL | 1 refills | Status: DC

## 2017-08-02 MED ORDER — NORGESTIMATE-ETH ESTRADIOL 0.25-35 MG-MCG PO TABS: 1.0000 | ORAL_TABLET | Freq: Every day | ORAL | 11 refills | Status: DC

## 2017-08-02 NOTE — Patient Instructions (Signed)
Continue to track get your sleep .   Refill medications today   Ok to stay off of  Controller  Medicine cause of  Drowsiness .   Fu as needed . Or  cpx and pap next year .

## 2017-10-24 ENCOUNTER — Ambulatory Visit: Payer: PRIVATE HEALTH INSURANCE | Admitting: Internal Medicine

## 2017-10-24 ENCOUNTER — Encounter: Payer: Self-pay | Admitting: Internal Medicine

## 2017-10-24 VITALS — BP 106/68 | HR 72 | Temp 98.5°F | Wt 119.6 lb

## 2017-10-24 DIAGNOSIS — Z79899 Other long term (current) drug therapy: Secondary | ICD-10-CM

## 2017-10-24 DIAGNOSIS — R51 Headache: Secondary | ICD-10-CM

## 2017-10-24 DIAGNOSIS — F4321 Adjustment disorder with depressed mood: Secondary | ICD-10-CM | POA: Diagnosis not present

## 2017-10-24 DIAGNOSIS — R5383 Other fatigue: Secondary | ICD-10-CM | POA: Diagnosis not present

## 2017-10-24 DIAGNOSIS — R519 Headache, unspecified: Secondary | ICD-10-CM

## 2017-10-24 DIAGNOSIS — Z8349 Family history of other endocrine, nutritional and metabolic diseases: Secondary | ICD-10-CM
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
## 2017-10-24 NOTE — Patient Instructions (Addendum)
Will notify you  of labs when available.   I agree with  counseling   Need and maybe medication  Could help also. If needed.   Continue lifestyle intervention healthy eating and exercise .  And sleep in the interim .

## 2017-10-25 LAB — COMPREHENSIVE METABOLIC PANEL
ALBUMIN: 4.5 g/dL (ref 3.5–5.2)
ALK PHOS: 62 U/L (ref 39–117)
ALT: 9 U/L (ref 0–35)
AST: 14 U/L (ref 0–37)
BUN: 13 mg/dL (ref 6–23)
CHLORIDE: 102 meq/L (ref 96–112)
CO2: 29 mEq/L (ref 19–32)
CREATININE: 0.71 mg/dL (ref 0.40–1.20)
Calcium: 10.3 mg/dL (ref 8.4–10.5)
GFR: 102.81 mL/min (ref >60.00)
GLUCOSE: 94 mg/dL (ref 70–99)
POTASSIUM: 4.4 meq/L (ref 3.5–5.1)
SODIUM: 139 meq/L (ref 135–145)
TOTAL PROTEIN: 7.6 g/dL (ref 6.0–8.3)
Total Bilirubin: 0.3 mg/dL (ref 0.2–1.2)

## 2017-10-25 LAB — CBC WITH DIFFERENTIAL/PLATELET
BASOS ABS: 0.1 10*3/uL (ref 0.0–0.1)
Basophils Relative: 1.1 % (ref 0.0–3.0)
EOS ABS: 0.1 10*3/uL (ref 0.0–0.7)
Eosinophils Relative: 1.1 % (ref 0.0–5.0)
HEMATOCRIT: 42.2 % (ref 36.0–46.0)
Hemoglobin: 14.6 g/dL (ref 12.0–15.0)
LYMPHS PCT: 30 % (ref 12.0–46.0)
Lymphs Abs: 2.3 10*3/uL (ref 0.7–4.0)
MCHC: 34.5 g/dL (ref 30.0–36.0)
MCV: 96.2 fl (ref 78.0–100.0)
MONOS PCT: 5.1 % (ref 3.0–12.0)
Monocytes Absolute: 0.4 10*3/uL (ref 0.1–1.0)
Neutro Abs: 4.7 10*3/uL (ref 1.4–7.7)
Neutrophils Relative %: 62.7 % (ref 43.0–77.0)
Platelets: 402 10*3/uL — ABNORMAL HIGH (ref 150.0–400.0)
RBC: 4.39 Mil/uL (ref 3.87–5.11)
RDW: 12.9 % (ref 11.5–15.5)
WBC: 7.5 10*3/uL (ref 4.0–10.5)

## 2017-10-25 LAB — T4, FREE: FREE T4: 0.68 ng/dL (ref 0.60–1.60)

## 2017-10-25 LAB — TSH: TSH: 1.91 u[IU]/mL (ref 0.35–4.50)

## 2017-12-12 ENCOUNTER — Ambulatory Visit: Payer: PRIVATE HEALTH INSURANCE | Admitting: Psychology

## 2017-12-27 ENCOUNTER — Encounter: Payer: Self-pay | Admitting: Internal Medicine

## 2018-02-21 ENCOUNTER — Encounter: Payer: Self-pay | Admitting: Internal Medicine

## 2018-02-21 NOTE — Telephone Encounter (Signed)
Please advise Dr Regis Bill, thanks.  Pt last seen for mood/depression 10/24/17

## 2018-02-21 NOTE — Telephone Encounter (Signed)
Can begin lexapro  10 mg  Take 5 mg per day  for 5-7 days and then increase to  10 mg per day   Disp 30 refill x 1    Then ROV  in 3 weeks  After beginning med   For med check

## 2018-02-22 MED ORDER — ESCITALOPRAM OXALATE 10 MG PO TABS: ORAL_TABLET | ORAL | 1 refills | Status: DC

## 2018-02-22 NOTE — Telephone Encounter (Signed)
Rx sent Patient notified Nothing further needed.

## 2018-06-07 ENCOUNTER — Other Ambulatory Visit: Payer: Self-pay | Admitting: Internal Medicine

## 2018-07-24 ENCOUNTER — Encounter: Payer: Self-pay | Admitting: Family Medicine

## 2018-07-24 ENCOUNTER — Ambulatory Visit: Payer: PRIVATE HEALTH INSURANCE | Admitting: Family Medicine

## 2018-07-24 DIAGNOSIS — J069 Acute upper respiratory infection, unspecified: Secondary | ICD-10-CM | POA: Insufficient documentation

## 2018-07-24 MED ORDER — GUAIFENESIN-CODEINE 100-10 MG/5ML PO SOLN: 5.0000 mL | Freq: Three times a day (TID) | ORAL | 0 refills | Status: DC | PRN

## 2018-07-24 NOTE — Patient Instructions (Signed)
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
## 2018-07-24 NOTE — Assessment & Plan Note (Signed)
Rx for robitussin-ac Symptomatic therapy suggested: push fluids, rest, use vaporizer or mist prn and return office visit prn if symptoms persist or worsen. Lack of antibiotic effectiveness discussed with her. Call or return to clinic prn if these symptoms worsen or fail to improve as anticipated.

## 2018-07-27 NOTE — Telephone Encounter (Signed)
No effect on ear   If haiving pain then we should check  In office otherwise may just be fluid  Can see you tomorrow if needed

## 2018-07-27 NOTE — Telephone Encounter (Signed)
Pt seen on 07/24/18 with Luetta Nutting, DO No mention of issues with ears so that this is a new issue.  Would you like the patient to schedule an OV or see if sx's resolve as current illness resolves?    . Cough    present 4 days-has tried OTC with no improvement.   HPI  Melinda Bell is a 33 y.o. female who complains of congestion, sore throat, nasal blockage, post nasal drip, dry cough, headache and fever for 4-5 days. She denies a history of chest pain, dizziness, fatigue, myalgias, nausea, vomiting, wheezing and sputum production and denies a history of asthma. Patient does not smoke cigarettes.  OTC medications have been helpful for sore throat and congestion but not cough.  Cough interfering with sleep.     Please advise Dr Regis Bill, thanks.

## 2018-07-28 NOTE — Telephone Encounter (Signed)
See my response from yesterday   Not sure she  Got the message Contact her  To see if needs to be seen  Next week  Or other

## 2018-07-29 ENCOUNTER — Ambulatory Visit: Payer: PRIVATE HEALTH INSURANCE | Admitting: Internal Medicine

## 2018-07-29 VITALS — BP 116/72 | HR 74 | Temp 98.1°F | Wt 119.0 lb

## 2018-07-29 DIAGNOSIS — H6690 Otitis media, unspecified, unspecified ear: Secondary | ICD-10-CM | POA: Diagnosis not present

## 2018-07-29 MED ORDER — AZITHROMYCIN 250 MG PO TABS: ORAL_TABLET | ORAL | 0 refills | Status: DC
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
## 2018-07-29 NOTE — Patient Instructions (Signed)
   For cough:  Take Mucinex DM twice a day as needed until better Take the night time syrup   Use OTC  Flonase : 2 nasal sprays on each side of the nose in the morning until you feel better    Take the antibiotic as prescribed  (zithromax)  Call if not gradually better over the next  10 days  Call anytime if the symptoms are severe

## 2018-08-01 ENCOUNTER — Ambulatory Visit: Payer: PRIVATE HEALTH INSURANCE | Admitting: Internal Medicine

## 2018-08-03 ENCOUNTER — Other Ambulatory Visit: Payer: Self-pay | Admitting: Internal Medicine

## 2018-08-03 NOTE — Telephone Encounter (Signed)
Pt is requesting Lexapro Please advise  Last filled :06/08/18 Last OV:02/21/18

## 2018-12-19 ENCOUNTER — Other Ambulatory Visit: Payer: Self-pay

## 2018-12-19 ENCOUNTER — Encounter: Payer: Self-pay | Admitting: Internal Medicine

## 2018-12-19 ENCOUNTER — Ambulatory Visit (INDEPENDENT_AMBULATORY_CARE_PROVIDER_SITE_OTHER): Payer: PRIVATE HEALTH INSURANCE | Admitting: Internal Medicine

## 2018-12-19 DIAGNOSIS — F4321 Adjustment disorder with depressed mood: Secondary | ICD-10-CM | POA: Diagnosis not present

## 2018-12-19 DIAGNOSIS — Z79899 Other long term (current) drug therapy: Secondary | ICD-10-CM | POA: Diagnosis not present

## 2018-12-19 DIAGNOSIS — Z3041 Encounter for surveillance of contraceptive pills: Secondary | ICD-10-CM | POA: Diagnosis not present

## 2018-12-19 DIAGNOSIS — G43009 Migraine without aura, not intractable, without status migrainosus: Secondary | ICD-10-CM | POA: Diagnosis not present

## 2018-12-19 MED ORDER — SUMATRIPTAN SUCCINATE 100 MG PO TABS: ORAL_TABLET | ORAL | 2 refills | Status: DC

## 2018-12-19 MED ORDER — NORGESTIMATE-ETH ESTRADIOL 0.25-35 MG-MCG PO TABS: 1.0000 | ORAL_TABLET | Freq: Every day | ORAL | 2 refills | Status: DC

## 2018-12-19 MED ORDER — VALACYCLOVIR HCL 500 MG PO TABS: 500.0000 mg | ORAL_TABLET | Freq: Every day | ORAL | 2 refills | Status: DC

## 2018-12-19 MED ORDER — ESCITALOPRAM OXALATE 10 MG PO TABS: 10.0000 mg | ORAL_TABLET | Freq: Every day | ORAL | 2 refills | Status: DC
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
## 2019-11-22 ENCOUNTER — Telehealth: Payer: PRIVATE HEALTH INSURANCE | Admitting: Internal Medicine

## 2019-12-09 ENCOUNTER — Other Ambulatory Visit: Payer: Self-pay | Admitting: Internal Medicine

## 2019-12-13 NOTE — Telephone Encounter (Signed)
Advise go to [facility] for iud placement . Let us know how we can help if needed.

## 2020-04-18 ENCOUNTER — Telehealth (INDEPENDENT_AMBULATORY_CARE_PROVIDER_SITE_OTHER): Payer: PRIVATE HEALTH INSURANCE | Admitting: Internal Medicine

## 2020-04-18 ENCOUNTER — Encounter: Payer: Self-pay | Admitting: Internal Medicine

## 2020-04-18 ENCOUNTER — Other Ambulatory Visit: Payer: Self-pay

## 2020-04-18 ENCOUNTER — Telehealth: Payer: Self-pay | Admitting: Internal Medicine

## 2020-04-18 VITALS — Ht 64.0 in | Wt 135.0 lb

## 2020-04-18 DIAGNOSIS — Z79899 Other long term (current) drug therapy: Secondary | ICD-10-CM

## 2020-04-18 DIAGNOSIS — G43009 Migraine without aura, not intractable, without status migrainosus: Secondary | ICD-10-CM

## 2020-04-18 DIAGNOSIS — Z3041 Encounter for surveillance of contraceptive pills: Secondary | ICD-10-CM | POA: Diagnosis not present

## 2020-04-18 MED ORDER — SUMATRIPTAN SUCCINATE 100 MG PO TABS: ORAL_TABLET | ORAL | 3 refills | Status: DC
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
## 2020-04-18 NOTE — Telephone Encounter (Signed)
Called patient and worked her in for the 11:30am space and doing a virtual visit with Dr. Regis Bill. Patient also stated that she just started having cough, congestion, and severe fatigue and I advised for her to go get tested and advised Dr. Regis Bill.

## 2020-04-18 NOTE — Telephone Encounter (Signed)
Patient has had a migraine for 2 days and is now out of her Sumatriptan.  She needs a refill called in.    Pharmacy- [facility] [address]

## 2020-07-31 LAB — HM PAP SMEAR

## 2020-08-13 ENCOUNTER — Other Ambulatory Visit: Payer: Self-pay | Admitting: Obstetrics and Gynecology

## 2020-08-13 DIAGNOSIS — Z363 Encounter for antenatal screening for malformations: Secondary | ICD-10-CM

## 2020-10-09 ENCOUNTER — Encounter: Payer: Self-pay | Admitting: *Deleted

## 2020-10-14 ENCOUNTER — Other Ambulatory Visit: Payer: Self-pay

## 2020-10-14 ENCOUNTER — Ambulatory Visit: Payer: Self-pay | Attending: Obstetrics and Gynecology

## 2020-10-14 DIAGNOSIS — Z3A19 19 weeks gestation of pregnancy: Secondary | ICD-10-CM

## 2020-10-14 DIAGNOSIS — O34219 Maternal care for unspecified type scar from previous cesarean delivery: Secondary | ICD-10-CM

## 2020-10-14 DIAGNOSIS — Z363 Encounter for antenatal screening for malformations: Secondary | ICD-10-CM | POA: Insufficient documentation

## 2020-11-07 DIAGNOSIS — Z8279 Family history of other congenital malformations, deformations and chromosomal abnormalities: Secondary | ICD-10-CM | POA: Diagnosis not present

## 2020-11-07 DIAGNOSIS — O358XX1 Maternal care for other (suspected) fetal abnormality and damage, fetus 1: Secondary | ICD-10-CM | POA: Diagnosis not present

## 2020-11-07 DIAGNOSIS — Z3A22 22 weeks gestation of pregnancy: Secondary | ICD-10-CM | POA: Diagnosis not present

## 2020-11-14 ENCOUNTER — Telehealth: Payer: Self-pay

## 2020-11-14 LAB — OB RESULTS CONSOLE RPR: RPR: NONREACTIVE

## 2020-11-14 LAB — OB RESULTS CONSOLE ABO/RH: RH Type: POSITIVE

## 2020-11-14 LAB — OB RESULTS CONSOLE HIV ANTIBODY (ROUTINE TESTING): HIV: NONREACTIVE

## 2020-11-14 LAB — OB RESULTS CONSOLE GC/CHLAMYDIA
Chlamydia: NEGATIVE
Gonorrhea: NEGATIVE

## 2020-11-14 LAB — OB RESULTS CONSOLE RUBELLA ANTIBODY, IGM: Rubella: NON-IMMUNE/NOT IMMUNE

## 2020-11-14 LAB — OB RESULTS CONSOLE HEPATITIS B SURFACE ANTIGEN: Hepatitis B Surface Ag: NEGATIVE

## 2020-11-14 LAB — OB RESULTS CONSOLE ANTIBODY SCREEN: Antibody Screen: NEGATIVE

## 2020-11-14 NOTE — Telephone Encounter (Signed)
Per Trula Ore patient had Fetal Echo at [facility] on:  11/07/20

## 2020-12-22 ENCOUNTER — Inpatient Hospital Stay (HOSPITAL_COMMUNITY)
Admission: AD | Admit: 2020-12-22 | Discharge: 2020-12-22 | Disposition: A | Discharge status: Home / Self Care | Payer: Medicaid Other | Attending: Obstetrics | Admitting: Obstetrics

## 2020-12-22 ENCOUNTER — Other Ambulatory Visit: Payer: Self-pay

## 2020-12-22 ENCOUNTER — Inpatient Hospital Stay (HOSPITAL_BASED_OUTPATIENT_CLINIC_OR_DEPARTMENT_OTHER): Payer: Medicaid Other

## 2020-12-22 ENCOUNTER — Encounter (HOSPITAL_COMMUNITY): Payer: Self-pay | Admitting: Obstetrics

## 2020-12-22 DIAGNOSIS — O4693 Antepartum hemorrhage, unspecified, third trimester: Secondary | ICD-10-CM | POA: Diagnosis not present

## 2020-12-22 DIAGNOSIS — Z87891 Personal history of nicotine dependence: Secondary | ICD-10-CM | POA: Diagnosis not present

## 2020-12-22 DIAGNOSIS — O321XX0 Maternal care for breech presentation, not applicable or unspecified: Secondary | ICD-10-CM | POA: Diagnosis not present

## 2020-12-22 DIAGNOSIS — Z3A28 28 weeks gestation of pregnancy: Secondary | ICD-10-CM

## 2020-12-22 DIAGNOSIS — O4393 Unspecified placental disorder, third trimester: Secondary | ICD-10-CM

## 2020-12-22 LAB — WET PREP, GENITAL
Clue Cells Wet Prep HPF POC: NONE SEEN
Sperm: NONE SEEN
Trich, Wet Prep: NONE SEEN
Yeast Wet Prep HPF POC: NONE SEEN

## 2020-12-22 LAB — URINALYSIS, ROUTINE W REFLEX MICROSCOPIC
Bilirubin Urine: NEGATIVE
Glucose, UA: NEGATIVE mg/dL
Hgb urine dipstick: NEGATIVE
Ketones, ur: NEGATIVE mg/dL
Leukocytes,Ua: NEGATIVE
Nitrite: NEGATIVE
Protein, ur: NEGATIVE mg/dL
Specific Gravity, Urine: 1.003 — ABNORMAL LOW (ref 1.005–1.030)
pH: 7 (ref 5.0–8.0)

## 2020-12-22 NOTE — Discharge Instructions (Signed)
-  return to MAU if bleeding gets heavier, if start passing clots, pain increases -Return if any concerns of baby's movements, contractions, etc.
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
## 2020-12-22 NOTE — MAU Note (Signed)
Melinda Bell is a 35 y.o. at [redacted]w[redacted]d here in MAU reporting: vaginal bleeding that started this AM. States it started after having a BM. Is only seeing the bleeding when she uses the bathroom and the last time she saw blood into the toilet. Having some cramping. Unsure about LOF, having clear and white discharge for the past couple of weeks. DFM today.  Onset of complaint: today  Pain score: 2/10  Vitals:   12/22/20 1446  BP: 112/65  Pulse: 75  Resp: 16  Temp: 98.3 F (36.8 C)  SpO2: 97%     FHT: EFM applied in room  Lab orders placed from triage: UA

## 2020-12-23 LAB — GC/CHLAMYDIA PROBE AMP (~~LOC~~) NOT AT ARMC
Chlamydia: NEGATIVE
Comment: NEGATIVE
Comment: NORMAL
Neisseria Gonorrhea: NEGATIVE

## 2021-02-11 DIAGNOSIS — Z3A36 36 weeks gestation of pregnancy: Secondary | ICD-10-CM | POA: Diagnosis not present

## 2021-02-11 DIAGNOSIS — Z3482 Encounter for supervision of other normal pregnancy, second trimester: Secondary | ICD-10-CM | POA: Diagnosis not present

## 2021-02-11 DIAGNOSIS — O365931 Maternal care for other known or suspected poor fetal growth, third trimester, fetus 1: Secondary | ICD-10-CM | POA: Diagnosis not present

## 2021-02-11 LAB — OB RESULTS CONSOLE GBS: GBS: NEGATIVE

## 2021-02-20 NOTE — Patient Instructions (Signed)
Melinda Bell  02/20/2021   Your procedure is scheduled on:  03/04/2021  Arrive at [place] at Entrance C on [address] at [facility]  and [employer]. You are invited to use the FREE valet parking or use the Visitor's parking deck.  Pick up the phone at the desk and dial [phone].  Call this number if you have problems the morning of surgery: [phone]  Remember:   Do not eat food:(After Midnight) Desps de medianoche.  Do not drink clear liquids: (After Midnight) Desps de medianoche.  Take these medicines the morning of surgery with A SIP OF WATER:  Take valtrex as prescribed   Do not wear jewelry, make-up or nail polish.  Do not wear lotions, powders, or perfumes. Do not wear deodorant.  Do not shave 48 hours prior to surgery.  Do not bring valuables to the hospital.  [facility] is not   responsible for any belongings or valuables brought to the hospital.  Contacts, dentures or bridgework may not be worn into surgery.  Leave suitcase in the car. After surgery it may be brought to your room.  For patients admitted to the hospital, checkout time is 11:00 AM the day of              discharge.      Please read over the following fact sheets that you were given:     Preparing for Surgery

## 2021-02-23 ENCOUNTER — Telehealth (HOSPITAL_COMMUNITY): Payer: Self-pay | Admitting: *Deleted

## 2021-02-23 NOTE — Telephone Encounter (Signed)
Preadmission screen  

## 2021-02-24 ENCOUNTER — Encounter (HOSPITAL_COMMUNITY): Payer: Self-pay

## 2021-03-02 ENCOUNTER — Encounter (HOSPITAL_COMMUNITY)
Admission: RE | Admit: 2021-03-02 | Discharge: 2021-03-02 | Disposition: A | Discharge status: Home / Self Care | Payer: Medicaid Other | Source: Ambulatory Visit | Attending: Obstetrics and Gynecology | Admitting: Obstetrics and Gynecology

## 2021-03-02 ENCOUNTER — Other Ambulatory Visit: Payer: Self-pay

## 2021-03-02 ENCOUNTER — Inpatient Hospital Stay (HOSPITAL_COMMUNITY)
Admission: AD | Admit: 2021-03-02 | Discharge: 2021-03-02 | Disposition: A | Discharge status: Home / Self Care | Payer: Medicaid Other | Attending: Obstetrics & Gynecology | Admitting: Obstetrics & Gynecology

## 2021-03-02 ENCOUNTER — Encounter (HOSPITAL_COMMUNITY): Payer: Self-pay | Admitting: Obstetrics & Gynecology

## 2021-03-02 DIAGNOSIS — Z01812 Encounter for preprocedural laboratory examination: Secondary | ICD-10-CM | POA: Insufficient documentation

## 2021-03-02 DIAGNOSIS — O471 False labor at or after 37 completed weeks of gestation: Secondary | ICD-10-CM | POA: Diagnosis not present

## 2021-03-02 DIAGNOSIS — Z0371 Encounter for suspected problem with amniotic cavity and membrane ruled out: Secondary | ICD-10-CM | POA: Diagnosis not present

## 2021-03-02 DIAGNOSIS — Z20822 Contact with and (suspected) exposure to covid-19: Secondary | ICD-10-CM | POA: Insufficient documentation

## 2021-03-02 DIAGNOSIS — Z3A38 38 weeks gestation of pregnancy: Secondary | ICD-10-CM | POA: Diagnosis not present

## 2021-03-02 HISTORY — DX: Headache, unspecified: R51.9

## 2021-03-02 HISTORY — DX: Anxiety disorder, unspecified: F41.9

## 2021-03-02 HISTORY — DX: Unspecified abnormal cytological findings in specimens from vagina: R87.629

## 2021-03-02 HISTORY — DX: Depression, unspecified: F32.A

## 2021-03-02 LAB — CBC WITH DIFFERENTIAL/PLATELET
Abs Immature Granulocytes: 0.07 10*3/uL (ref 0.00–0.07)
Basophils Absolute: 0 10*3/uL (ref 0.0–0.1)
Basophils Relative: 0 %
Eosinophils Absolute: 0.1 10*3/uL (ref 0.0–0.5)
Eosinophils Relative: 1 %
HCT: 34.8 % — ABNORMAL LOW (ref 36.0–46.0)
Hemoglobin: 11.2 g/dL — ABNORMAL LOW (ref 12.0–15.0)
Immature Granulocytes: 1 %
Lymphocytes Relative: 21 %
Lymphs Abs: 1.7 10*3/uL (ref 0.7–4.0)
MCH: 28.1 pg (ref 26.0–34.0)
MCHC: 32.2 g/dL (ref 30.0–36.0)
MCV: 87.2 fL (ref 80.0–100.0)
Monocytes Absolute: 0.4 10*3/uL (ref 0.1–1.0)
Monocytes Relative: 5 %
Neutro Abs: 5.8 10*3/uL (ref 1.7–7.7)
Neutrophils Relative %: 72 %
Platelets: 352 10*3/uL (ref 150–400)
RBC: 3.99 MIL/uL (ref 3.87–5.11)
RDW: 14.8 % (ref 11.5–15.5)
WBC: 8.1 10*3/uL (ref 4.0–10.5)
nRBC: 0 % (ref 0.0–0.2)

## 2021-03-02 LAB — TYPE AND SCREEN
ABO/RH(D): A POS
Antibody Screen: NEGATIVE

## 2021-03-02 LAB — AMNISURE RUPTURE OF MEMBRANE (ROM) NOT AT ARMC: Amnisure ROM: NEGATIVE

## 2021-03-02 LAB — POCT FERN TEST: POCT Fern Test: NEGATIVE

## 2021-03-02 LAB — SARS CORONAVIRUS 2 (TAT 6-24 HRS): SARS Coronavirus 2: NEGATIVE

## 2021-03-02 NOTE — MAU Note (Signed)
Having consistently inconsistent contractions since Friday night.  Had ? Leaking yesterday and this morning- was not wearing a pad, reports wet underwear. Clear, no mucous, no blood

## 2021-03-02 NOTE — MAU Provider Note (Signed)
S: [name] is a 35 y.o. G2P1002 at [redacted]w[redacted]d who presents to MAU today complaining of leaking of fluid since yesterday.   She denies vaginal bleeding. She endorses contractions. She reports normal fetal movement.    O: BP 110/72 (BP Location: Right Arm)   Pulse 76   Temp 97.9 F (36.6 C) (Oral)   Resp 16   LMP 06/03/2020   SpO2 100%  GENERAL: Well-developed, well-nourished female in no acute distress.  HEAD: Normocephalic, atraumatic.  CHEST: Normal effort of breathing, regular heart rate ABDOMEN: Soft, nontender, gravid   Cervical exam:  Dilation: Closed Effacement (%): Thick Cervical Position: Posterior, Middle Station: Ballotable Exam by:: jolynn   Fetal Monitoring: Baseline: 120 bpm Variability: Moderate Accelerations: 15x15 Decelerations: none Contractions: irregular pattern   Results for orders placed or performed during the hospital encounter of 03/02/21 (from the past 24 hour(s))  Fern Test     Status: None   Collection Time: 03/02/21 12:20 PM  Result Value Ref Range   POCT Fern Test Negative = intact amniotic membranes   [facility]    Status: None   Collection Time: 03/02/21 12:46 PM  Result Value Ref Range   Amnisure ROM NEGATIVE      A: SIUP at 348w6dIntact membranes   P:  Return to MAU if symptoms worsen Labor precautions.   RaNoni Saupe, NP 03/02/2021 2:07 PM

## 2021-03-03 ENCOUNTER — Encounter (HOSPITAL_COMMUNITY): Payer: Self-pay | Admitting: Obstetrics and Gynecology

## 2021-03-03 LAB — RPR: RPR Ser Ql: NONREACTIVE
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
## 2021-03-03 NOTE — Anesthesia Preprocedure Evaluation (Addendum)
Anesthesia Evaluation    Airway Mallampati: I  TM Distance: >3 FB Neck ROM: Full    Dental no notable dental hx. (+) Teeth Intact, Dental Advisory Given   Pulmonary former smoker,    Pulmonary exam normal breath sounds clear to auscultation       Cardiovascular negative cardio ROS Normal cardiovascular exam Rhythm:Regular Rate:Normal     Neuro/Psych  Headaches, PSYCHIATRIC DISORDERS Anxiety Depression    GI/Hepatic Neg liver ROS, GERD  ,  Endo/Other  negative endocrine ROS  Renal/GU negative Renal ROS  negative genitourinary   Musculoskeletal negative musculoskeletal ROS (+)   Abdominal   Peds  Hematology  (+) anemia ,   Anesthesia Other Findings   Reproductive/Obstetrics (+) Pregnancy Previous C/Section HSV                           Anesthesia Physical Anesthesia Plan  ASA: 2  Anesthesia Plan: Spinal   Post-op Pain Management:    Induction:   PONV Risk Score and Plan: 4 or greater and Treatment may vary due to age or medical condition and Scopolamine patch - Pre-op  Airway Management Planned: Natural Airway  Additional Equipment:   Intra-op Plan:   Post-operative Plan:   Informed Consent: I have reviewed the patients History and Physical, chart, labs and discussed the procedure including the risks, benefits and alternatives for the proposed anesthesia with the patient or authorized representative who has indicated his/her understanding and acceptance.     Dental advisory given  Plan Discussed with: Anesthesiologist and CRNA  Anesthesia Plan Comments:        Anesthesia Quick Evaluation

## 2021-03-04 ENCOUNTER — Other Ambulatory Visit: Payer: Self-pay

## 2021-03-04 ENCOUNTER — Inpatient Hospital Stay (HOSPITAL_COMMUNITY): Payer: Medicaid Other | Admitting: Anesthesiology

## 2021-03-04 ENCOUNTER — Encounter (HOSPITAL_COMMUNITY): Payer: Self-pay | Admitting: Obstetrics and Gynecology

## 2021-03-04 ENCOUNTER — Encounter (HOSPITAL_COMMUNITY)
Admission: RE | Disposition: A | Discharge status: Home / Self Care | Payer: Self-pay | Source: Home / Self Care | Attending: Obstetrics and Gynecology

## 2021-03-04 ENCOUNTER — Inpatient Hospital Stay (HOSPITAL_COMMUNITY)
Admission: RE | Admit: 2021-03-04 | Discharge: 2021-03-06 | DRG: 788 | Disposition: A | Discharge status: Home / Self Care | Payer: Medicaid Other | Attending: Obstetrics and Gynecology | Admitting: Obstetrics and Gynecology

## 2021-03-04 DIAGNOSIS — Z87891 Personal history of nicotine dependence: Secondary | ICD-10-CM | POA: Diagnosis not present

## 2021-03-04 DIAGNOSIS — Z3A39 39 weeks gestation of pregnancy: Secondary | ICD-10-CM

## 2021-03-04 DIAGNOSIS — O34211 Maternal care for low transverse scar from previous cesarean delivery: Secondary | ICD-10-CM | POA: Diagnosis not present

## 2021-03-04 DIAGNOSIS — Z3A00 Weeks of gestation of pregnancy not specified: Secondary | ICD-10-CM | POA: Diagnosis not present

## 2021-03-04 DIAGNOSIS — O9902 Anemia complicating childbirth: Secondary | ICD-10-CM | POA: Diagnosis not present

## 2021-03-04 DIAGNOSIS — Z20822 Contact with and (suspected) exposure to covid-19: Secondary | ICD-10-CM | POA: Diagnosis not present

## 2021-03-04 DIAGNOSIS — Z23 Encounter for immunization: Secondary | ICD-10-CM

## 2021-03-04 DIAGNOSIS — D649 Anemia, unspecified: Secondary | ICD-10-CM | POA: Diagnosis not present

## 2021-03-04 DIAGNOSIS — Z98891 History of uterine scar from previous surgery: Secondary | ICD-10-CM

## 2021-03-04 SURGERY — Surgical Case
Anesthesia: Spinal | Wound class: Clean Contaminated

## 2021-03-04 MED ORDER — OXYTOCIN-SODIUM CHLORIDE 30-0.9 UT/500ML-% IV SOLN
INTRAVENOUS | Status: DC | PRN
  Administered 2021-03-04: 30 [IU] via INTRAVENOUS

## 2021-03-04 MED ORDER — FLEET ENEMA 7-19 GM/118ML RE ENEM: 1.0000 | ENEMA | Freq: Every day | RECTAL | Status: DC | PRN

## 2021-03-04 MED ORDER — NALBUPHINE HCL 10 MG/ML IJ SOLN: 5.0000 mg | Freq: Once | INTRAMUSCULAR | Status: DC | PRN

## 2021-03-04 MED ORDER — ONDANSETRON HCL 4 MG/2ML IJ SOLN
INTRAMUSCULAR | Status: AC
  Filled 2021-03-04: qty 2

## 2021-03-04 MED ORDER — METHYLERGONOVINE MALEATE 0.2 MG PO TABS
0.2000 mg | ORAL_TABLET | ORAL | Status: DC | PRN
Start: 2021-03-04 — End: 2021-03-06

## 2021-03-04 MED ORDER — ACETAMINOPHEN 500 MG PO TABS
ORAL_TABLET | ORAL | Status: AC
  Filled 2021-03-04: qty 2

## 2021-03-04 MED ORDER — TETANUS-DIPHTH-ACELL PERTUSSIS 5-2.5-18.5 LF-MCG/0.5 IM SUSY: 0.5000 mL | PREFILLED_SYRINGE | Freq: Once | INTRAMUSCULAR | Status: DC

## 2021-03-04 MED ORDER — IBUPROFEN 800 MG PO TABS
800.0000 mg | ORAL_TABLET | Freq: Three times a day (TID) | ORAL | Status: DC
Start: 2021-03-04 — End: 2021-03-06
  Administered 2021-03-04 – 2021-03-06 (×6): 800 mg via ORAL
  Filled 2021-03-04 (×6): qty 1

## 2021-03-04 MED ORDER — FENTANYL CITRATE (PF) 100 MCG/2ML IJ SOLN: 25.0000 ug | INTRAMUSCULAR | Status: DC | PRN

## 2021-03-04 MED ORDER — SODIUM CHLORIDE 0.9% FLUSH: 3.0000 mL | INTRAVENOUS | Status: DC | PRN

## 2021-03-04 MED ORDER — PHENYLEPHRINE HCL-NACL 20-0.9 MG/250ML-% IV SOLN
INTRAVENOUS | Status: DC | PRN
  Administered 2021-03-04: 60 ug/min via INTRAVENOUS

## 2021-03-04 MED ORDER — SENNOSIDES-DOCUSATE SODIUM 8.6-50 MG PO TABS
2.0000 | ORAL_TABLET | ORAL | Status: DC
  Administered 2021-03-04 – 2021-03-06 (×3): 2 via ORAL
  Filled 2021-03-04 (×3): qty 2

## 2021-03-04 MED ORDER — DIBUCAINE (PERIANAL) 1 % EX OINT: 1.0000 "application " | TOPICAL_OINTMENT | CUTANEOUS | Status: DC | PRN

## 2021-03-04 MED ORDER — ONDANSETRON HCL 4 MG/2ML IJ SOLN: 4.0000 mg | Freq: Three times a day (TID) | INTRAMUSCULAR | Status: DC | PRN

## 2021-03-04 MED ORDER — NALBUPHINE HCL 10 MG/ML IJ SOLN: 5.0000 mg | INTRAMUSCULAR | Status: DC | PRN

## 2021-03-04 MED ORDER — PRENATAL MULTIVITAMIN CH
1.0000 | ORAL_TABLET | Freq: Every day | ORAL | Status: DC
  Administered 2021-03-05 – 2021-03-06 (×2): 1 via ORAL
  Filled 2021-03-04 (×2): qty 1

## 2021-03-04 MED ORDER — COCONUT OIL OIL
1.0000 "application " | TOPICAL_OIL | Status: DC | PRN
  Administered 2021-03-06: 1 via TOPICAL

## 2021-03-04 MED ORDER — EPHEDRINE SULFATE-NACL 50-0.9 MG/10ML-% IV SOSY: PREFILLED_SYRINGE | INTRAVENOUS | Status: DC | PRN

## 2021-03-04 MED ORDER — SIMETHICONE 80 MG PO CHEW
80.0000 mg | CHEWABLE_TABLET | Freq: Three times a day (TID) | ORAL | Status: DC
  Administered 2021-03-04 – 2021-03-06 (×5): 80 mg via ORAL
  Filled 2021-03-04 (×5): qty 1

## 2021-03-04 MED ORDER — BUPIVACAINE IN DEXTROSE 0.75-8.25 % IT SOLN
INTRATHECAL | Status: AC
  Filled 2021-03-04: qty 2

## 2021-03-04 MED ORDER — METHYLERGONOVINE MALEATE 0.2 MG/ML IJ SOLN
0.2000 mg | INTRAMUSCULAR | Status: DC | PRN
Start: 2021-03-04 — End: 2021-03-06

## 2021-03-04 MED ORDER — ONDANSETRON HCL 4 MG/2ML IJ SOLN
INTRAMUSCULAR | Status: DC | PRN
Start: 2021-03-04 — End: 2021-03-04
  Administered 2021-03-04: 4 mg via INTRAVENOUS

## 2021-03-04 MED ORDER — KETOROLAC TROMETHAMINE 30 MG/ML IJ SOLN
INTRAMUSCULAR | Status: AC
  Filled 2021-03-04: qty 1

## 2021-03-04 MED ORDER — SOD CITRATE-CITRIC ACID 500-334 MG/5ML PO SOLN
ORAL | Status: AC
  Filled 2021-03-04: qty 30

## 2021-03-04 MED ORDER — PHENYLEPHRINE HCL-NACL 20-0.9 MG/250ML-% IV SOLN
INTRAVENOUS | Status: AC
  Filled 2021-03-04: qty 250

## 2021-03-04 MED ORDER — DIPHENHYDRAMINE HCL 25 MG PO CAPS: 25.0000 mg | ORAL_CAPSULE | ORAL | Status: DC | PRN

## 2021-03-04 MED ORDER — SODIUM CHLORIDE 0.9 % IR SOLN
Status: DC | PRN
  Administered 2021-03-04: 1000 mL

## 2021-03-04 MED ORDER — STERILE WATER FOR IRRIGATION IR SOLN
Status: DC | PRN
  Administered 2021-03-04: 1000 mL

## 2021-03-04 MED ORDER — MORPHINE SULFATE (PF) 0.5 MG/ML IJ SOLN: INTRAMUSCULAR | Status: DC | PRN

## 2021-03-04 MED ORDER — KETOROLAC TROMETHAMINE 30 MG/ML IJ SOLN: 30.0000 mg | Freq: Four times a day (QID) | INTRAMUSCULAR | Status: AC | PRN

## 2021-03-04 MED ORDER — SIMETHICONE 80 MG PO CHEW
80.0000 mg | CHEWABLE_TABLET | ORAL | Status: DC | PRN
  Administered 2021-03-06: 80 mg via ORAL
  Filled 2021-03-04: qty 1

## 2021-03-04 MED ORDER — DEXAMETHASONE SODIUM PHOSPHATE 10 MG/ML IJ SOLN
INTRAMUSCULAR | Status: AC
  Filled 2021-03-04: qty 1

## 2021-03-04 MED ORDER — CLINDAMYCIN PHOSPHATE 900 MG/50ML IV SOLN
900.0000 mg | INTRAVENOUS | Status: AC
  Administered 2021-03-04: 900 mg via INTRAVENOUS

## 2021-03-04 MED ORDER — OXYCODONE-ACETAMINOPHEN 5-325 MG PO TABS
1.0000 | ORAL_TABLET | ORAL | Status: DC | PRN
  Administered 2021-03-04 – 2021-03-05 (×3): 1 via ORAL
  Filled 2021-03-04 (×3): qty 1

## 2021-03-04 MED ORDER — BUPIVACAINE IN DEXTROSE 0.75-8.25 % IT SOLN
INTRATHECAL | Status: DC | PRN
  Administered 2021-03-04: 1.6 mL via INTRATHECAL

## 2021-03-04 MED ORDER — FERROUS SULFATE 325 (65 FE) MG PO TABS
325.0000 mg | ORAL_TABLET | Freq: Two times a day (BID) | ORAL | Status: DC
  Administered 2021-03-04 – 2021-03-06 (×4): 325 mg via ORAL
  Filled 2021-03-04 (×4): qty 1

## 2021-03-04 MED ORDER — DEXTROSE 5 % IV SOLN
1.0000 ug/kg/h | INTRAVENOUS | Status: DC | PRN
  Filled 2021-03-04: qty 5

## 2021-03-04 MED ORDER — DIPHENHYDRAMINE HCL 50 MG/ML IJ SOLN
12.5000 mg | INTRAMUSCULAR | Status: DC | PRN
  Administered 2021-03-04: 12.5 mg via INTRAVENOUS
  Filled 2021-03-04: qty 1

## 2021-03-04 MED ORDER — SCOPOLAMINE 1 MG/3DAYS TD PT72
1.0000 | MEDICATED_PATCH | Freq: Once | TRANSDERMAL | Status: DC
  Administered 2021-03-04: 1.5 mg via TRANSDERMAL

## 2021-03-04 MED ORDER — FENTANYL CITRATE (PF) 100 MCG/2ML IJ SOLN
INTRAMUSCULAR | Status: AC
  Filled 2021-03-04: qty 2

## 2021-03-04 MED ORDER — ZOLPIDEM TARTRATE 5 MG PO TABS: 5.0000 mg | ORAL_TABLET | Freq: Every evening | ORAL | Status: DC | PRN

## 2021-03-04 MED ORDER — OXYTOCIN-SODIUM CHLORIDE 30-0.9 UT/500ML-% IV SOLN
2.5000 [IU]/h | INTRAVENOUS | Status: AC
  Administered 2021-03-04: 2.5 [IU]/h via INTRAVENOUS
  Filled 2021-03-04: qty 500

## 2021-03-04 MED ORDER — MORPHINE SULFATE (PF) 0.5 MG/ML IJ SOLN
INTRAMUSCULAR | Status: AC
  Filled 2021-03-04: qty 10

## 2021-03-04 MED ORDER — MEPERIDINE HCL 25 MG/ML IJ SOLN: 6.2500 mg | INTRAMUSCULAR | Status: DC | PRN

## 2021-03-04 MED ORDER — GENTAMICIN SULFATE 40 MG/ML IJ SOLN
5.0000 mg/kg | INTRAVENOUS | Status: AC
  Administered 2021-03-04: 330 mg via INTRAVENOUS
  Filled 2021-03-04: qty 8.25

## 2021-03-04 MED ORDER — MEASLES, MUMPS & RUBELLA VAC IJ SOLR
0.5000 mL | Freq: Once | INTRAMUSCULAR | Status: AC
  Administered 2021-03-05: 0.5 mL via SUBCUTANEOUS
  Filled 2021-03-04 (×2): qty 0.5

## 2021-03-04 MED ORDER — CLINDAMYCIN PHOSPHATE 900 MG/50ML IV SOLN
INTRAVENOUS | Status: AC
  Filled 2021-03-04: qty 50

## 2021-03-04 MED ORDER — MORPHINE SULFATE (PF) 0.5 MG/ML IJ SOLN
INTRAMUSCULAR | Status: DC | PRN
  Administered 2021-03-04: .15 mg via INTRATHECAL

## 2021-03-04 MED ORDER — NALOXONE HCL 0.4 MG/ML IJ SOLN: 0.4000 mg | INTRAMUSCULAR | Status: DC | PRN

## 2021-03-04 MED ORDER — ACETAMINOPHEN 500 MG PO TABS
1000.0000 mg | ORAL_TABLET | Freq: Once | ORAL | Status: AC
  Administered 2021-03-04: 1000 mg via ORAL

## 2021-03-04 MED ORDER — LACTATED RINGERS IV SOLN: INTRAVENOUS | Status: DC

## 2021-03-04 MED ORDER — POVIDONE-IODINE 10 % EX SOLN
Freq: Once | CUTANEOUS | Status: AC
  Filled 2021-03-04: qty 118

## 2021-03-04 MED ORDER — OXYTOCIN-SODIUM CHLORIDE 30-0.9 UT/500ML-% IV SOLN
INTRAVENOUS | Status: AC
  Filled 2021-03-04: qty 500

## 2021-03-04 MED ORDER — SCOPOLAMINE 1 MG/3DAYS TD PT72
MEDICATED_PATCH | TRANSDERMAL | Status: AC
  Filled 2021-03-04: qty 1

## 2021-03-04 MED ORDER — MENTHOL 3 MG MT LOZG: 1.0000 | LOZENGE | OROMUCOSAL | Status: DC | PRN

## 2021-03-04 MED ORDER — WITCH HAZEL-GLYCERIN EX PADS: 1.0000 "application " | MEDICATED_PAD | CUTANEOUS | Status: DC | PRN

## 2021-03-04 MED ORDER — SOD CITRATE-CITRIC ACID 500-334 MG/5ML PO SOLN
30.0000 mL | ORAL | Status: AC
  Administered 2021-03-04: 30 mL via ORAL

## 2021-03-04 MED ORDER — PHENYLEPHRINE 40 MCG/ML (10ML) SYRINGE FOR IV PUSH (FOR BLOOD PRESSURE SUPPORT)
PREFILLED_SYRINGE | INTRAVENOUS | Status: AC
  Filled 2021-03-04: qty 10

## 2021-03-04 MED ORDER — FENTANYL CITRATE (PF) 100 MCG/2ML IJ SOLN
INTRAMUSCULAR | Status: DC | PRN
  Administered 2021-03-04: 15 ug via INTRATHECAL

## 2021-03-04 MED ORDER — KETOROLAC TROMETHAMINE 30 MG/ML IJ SOLN
30.0000 mg | Freq: Four times a day (QID) | INTRAMUSCULAR | Status: AC | PRN
  Administered 2021-03-04: 30 mg via INTRAVENOUS

## 2021-03-04 MED ORDER — BISACODYL 10 MG RE SUPP: 10.0000 mg | Freq: Every day | RECTAL | Status: DC | PRN

## 2021-03-04 SURGICAL SUPPLY — 34 items
BENZOIN TINCTURE PRP APPL 2/3 (GAUZE/BANDAGES/DRESSINGS) ×2 IMPLANT
CHLORAPREP W/TINT 26ML (MISCELLANEOUS) ×2 IMPLANT
CLAMP CORD UMBIL (MISCELLANEOUS) IMPLANT
CLOTH BEACON ORANGE TIMEOUT ST (SAFETY) ×2 IMPLANT
DRAPE C SECTION CLR SCREEN (DRAPES) ×2 IMPLANT
DRSG OPSITE POSTOP 4X10 (GAUZE/BANDAGES/DRESSINGS) ×2 IMPLANT
ELECT REM PT RETURN 9FT ADLT (ELECTROSURGICAL) ×2
ELECTRODE REM PT RTRN 9FT ADLT (ELECTROSURGICAL) ×1 IMPLANT
EXTRACTOR VACUUM BELL STYLE (SUCTIONS) ×2 IMPLANT
GLOVE BIO SURGEON STRL SZ7 (GLOVE) ×2 IMPLANT
GLOVE BIOGEL PI IND STRL 7.0 (GLOVE) ×1 IMPLANT
GLOVE BIOGEL PI INDICATOR 7.0 (GLOVE) ×1
GOWN STRL REUS W/TWL LRG LVL3 (GOWN DISPOSABLE) ×4 IMPLANT
KIT ABG SYR 3ML LUER SLIP (SYRINGE) IMPLANT
NEEDLE HYPO 25X5/8 SAFETYGLIDE (NEEDLE) IMPLANT
NS IRRIG 1000ML POUR BTL (IV SOLUTION) ×2 IMPLANT
PACK C SECTION WH (CUSTOM PROCEDURE TRAY) ×2 IMPLANT
PAD OB MATERNITY 4.3X12.25 (PERSONAL CARE ITEMS) ×2 IMPLANT
PENCIL SMOKE EVAC W/HOLSTER (ELECTROSURGICAL) ×2 IMPLANT
RTRCTR C-SECT PINK 25CM LRG (MISCELLANEOUS) ×2 IMPLANT
STRIP CLOSURE SKIN 1/2X4 (GAUZE/BANDAGES/DRESSINGS) ×2 IMPLANT
SUT MNCRL 0 VIOLET CTX 36 (SUTURE) ×3 IMPLANT
SUT MONOCRYL 0 CTX 36 (SUTURE) ×6
SUT PLAIN 2 0 (SUTURE) ×2
SUT PLAIN 2 0 XLH (SUTURE) IMPLANT
SUT PLAIN ABS 2-0 CT1 27XMFL (SUTURE) ×1 IMPLANT
SUT VIC AB 0 CT1 27 (SUTURE) ×4
SUT VIC AB 0 CT1 27XBRD ANBCTR (SUTURE) ×2 IMPLANT
SUT VIC AB 2-0 CT1 27 (SUTURE) ×2
SUT VIC AB 2-0 CT1 TAPERPNT 27 (SUTURE) ×1 IMPLANT
SUT VIC AB 4-0 KS 27 (SUTURE) ×2 IMPLANT
TOWEL OR 17X24 6PK STRL BLUE (TOWEL DISPOSABLE) ×2 IMPLANT
TRAY FOLEY W/BAG SLVR 14FR LF (SET/KITS/TRAYS/PACK) ×2 IMPLANT
WATER STERILE IRR 1000ML POUR (IV SOLUTION) ×2 IMPLANT

## 2021-03-04 NOTE — Progress Notes (Signed)
There has been no change in the patients history, status or exam since the history and physical.  There were no vitals filed for this visit.  Results for orders placed or performed during the hospital encounter of 03/02/21 (from the past 72 hour(s))  Fern Test     Status: None   Collection Time: 03/02/21 12:20 PM  Result Value Ref Range   POCT Fern Test Negative = intact amniotic membranes   Amnisure rupture of membrane (rom)not at [facility]     Status: None   Collection Time: 03/02/21 12:46 PM  Result Value Ref Range   Amnisure ROM NEGATIVE     Comment: Performed at [facility] [address] [place], [place] [address]  SARS CORONAVIRUS 2 (TAT 6-24 HRS) Nasopharyngeal Nasopharyngeal Swab     Status: None   Collection Time: 03/02/21  1:47 PM   Specimen: Nasopharyngeal Swab  Result Value Ref Range   SARS Coronavirus 2 NEGATIVE NEGATIVE    Comment: (NOTE) SARS-CoV-2 target nucleic acids are NOT DETECTED.  The SARS-CoV-2 RNA is generally detectable in upper and lower respiratory specimens during the acute phase of infection. Negative results do not preclude SARS-CoV-2 infection, do not rule out co-infections with other pathogens, and should not be used as the sole basis for treatment or other patient management decisions. Negative results must be combined with clinical observations, patient history, and epidemiological information. The expected result is Negative.  Fact Sheet for Patients: SugarRoll.be  Fact Sheet for Healthcare Providers: https://www.woods-mathews.com/  This test is not yet approved or cleared by the [place] FDA and  has been authorized for detection and/or diagnosis of SARS-CoV-2 by FDA under an Emergency Use Authorization (EUA). This EUA will remain  in effect (meaning this test can be used) for the duration of the COVID-19 declaration under Se ction 564(b)(1) of the Act, 21 U.S.C. section 360bbb-3(b)(1),  unless the authorization is terminated or revoked sooner.  Performed at [facility], [place] [address] [place], [place] [address]     Daria Pastures

## 2021-03-04 NOTE — Op Note (Signed)
03/04/2021  8:49 AM  PATIENT:  Melinda Bell  35 y.o. female  PRE-OPERATIVE DIAGNOSIS:  REPEAT CESAREAN SECTION  POST-OPERATIVE DIAGNOSIS:  REPEAT CESAREAN SECTION  PROCEDURE:  Procedure(s): CESAREAN SECTION (N/A)  SURGEON:  Surgeon(s) and Role:    * Bobbye Charleston, MD - Primary    * Rowland Lathe, MD - Assisting  ANESTHESIA:   spinal  EBL: 167 cc  LOCAL MEDICATIONS USED:  NONE  SPECIMEN:  No Specimen  DISPOSITION OF SPECIMEN:  PATHOLOGY  COUNTS:  YES  TOURNIQUET:  * No tourniquets in log *  DICTATION: .Note written in EPIC  PLAN OF CARE: Admit to inpatient   PATIENT DISPOSITION:  PACU - hemodynamically stable.   Delay start of Pharmacological VTE agent (>24hrs) due to surgical blood loss or risk of bleeding: not applicable  Complications:  none Medications:  Ancef, Pitocin Findings:  Baby female, Apgars 8.9, weight P.   Normal tubes, ovaries and uterus seen.  Baby was skin to skin with mother after birth in the OR.  Technique:  After adequate spinal anesthesia was achieved, the patient was prepped and draped in usual sterile fashion.  A foley catheter was used to drain the bladder.  A pfannanstiel incision was made with the scalpel and carried down to the fascia with the bovie cautery. The fascia was incised in the midline with the scalpel and carried in a transverse curvilinear manner bilaterally.  The fascia was reflected superiorly and inferiorly off the rectus muscles and the muscles split in the midline.  A bowel free portion of the peritoneum was entered bluntly and then extended in a superior and inferior manner with good visualization of the bowel and bladder.  The Alexis instrument was then placed and the vesico-uterine fascia tented up and incised in a transverse curvilinear manner.  A 2 cm transverse incision was made in the upper portion of the lower uterine segment until the amnion was exposed.   The incision was extended transversely in a blunt  manner.  Clear fluid was noted and the baby delivered in the vertex presentation without complication.  The baby was bulb suctioned and the cord was clamped and cut aftet stripping blood from cord into baby.  The baby was then handed to awaiting Neonatology.  The placenta was then delivered manually and the uterus cleared of all debris.  The uterine incision was then closed with a running lock stitch of 0 monocryl.  An imbricating layer of 0 monocryl was closed as well. Excellent hemostasis of the uterine incision was achieved and the abdomen was cleared with irrigation.  The peritoneum was closed with a running stitch of 2-0 vicryl.  This incorporated the rectus muscles as a separate layer.  The fascia was then closed with a running stitch of 0 vicryl.  The subcutaneous layer was closed with interrupted  stitches of 2-0 plain gut.  The skin was closed with 4-0 vicryl on a Keith needle and steri-strips.  The patient tolerated the procedure well and was returned to the recovery room in stable condition.  All counts were correct times three.  Daria Pastures

## 2021-03-04 NOTE — Anesthesia Postprocedure Evaluation (Signed)
Anesthesia Post Note  Patient: Melinda Bell  Procedure(s) Performed: CESAREAN SECTION     Patient location during evaluation: PACU Anesthesia Type: Spinal Level of consciousness: oriented and awake and alert Pain management: pain level controlled Vital Signs Assessment: post-procedure vital signs reviewed and stable Respiratory status: spontaneous breathing, respiratory function stable and nonlabored ventilation Cardiovascular status: blood pressure returned to baseline and stable Postop Assessment: no headache, no backache, no apparent nausea or vomiting and spinal receding Anesthetic complications: no   No notable events documented.  Last Vitals:  Vitals:   03/04/21 0948 03/04/21 0949  BP: 111/73   Pulse: 68 64  Resp: 13 13  Temp:    SpO2: 98% 100%    Last Pain:  Vitals:   03/04/21 0916  TempSrc:   PainSc: 0-No pain   Pain Goal:                Epidural/Spinal Function Cutaneous sensation: Able to Discern Pressure (03/04/21 0932), Patient able to flex knees: No (03/04/21 0932), Patient able to lift hips off bed: No (03/04/21 0932), Back pain beyond tenderness at insertion site: No (03/04/21 0932), Progressively worsening motor and/or sensory loss: No (03/04/21 0932), Bowel and/or bladder incontinence post epidural: No (03/04/21 0932)  Margarine Grosshans A.

## 2021-03-04 NOTE — Brief Op Note (Signed)
03/04/2021  8:49 AM  PATIENT:  Melinda Bell  35 y.o. female  PRE-OPERATIVE DIAGNOSIS:  REPEAT CESAREAN SECTION  POST-OPERATIVE DIAGNOSIS:  REPEAT CESAREAN SECTION  PROCEDURE:  Procedure(s): CESAREAN SECTION (N/A)  SURGEON:  Surgeon(s) and Role:    * Bobbye Charleston, MD - Primary    * Rowland Lathe, MD - Assisting  ANESTHESIA:   spinal  EBL: 167 cc  LOCAL MEDICATIONS USED:  NONE  SPECIMEN:  No Specimen  DISPOSITION OF SPECIMEN:  PATHOLOGY  COUNTS:  YES  TOURNIQUET:  * No tourniquets in log *  DICTATION: .Note written in EPIC  PLAN OF CARE: Admit to inpatient   PATIENT DISPOSITION:  PACU - hemodynamically stable.   Delay start of Pharmacological VTE agent (>24hrs) due to surgical blood loss or risk of bleeding: not applicable

## 2021-03-04 NOTE — Anesthesia Procedure Notes (Signed)
Spinal  Patient location during procedure: OR Start time: 03/04/2021 7:53 AM End time: 03/04/2021 7:57 AM Reason for block: surgical anesthesia Staffing Performed: anesthesiologist  Anesthesiologist: Josephine Igo, MD Preanesthetic Checklist Completed: patient identified, IV checked, site marked, risks and benefits discussed, surgical consent, monitors and equipment checked, pre-op evaluation and timeout performed Spinal Block Patient position: sitting Prep: DuraPrep Patient monitoring: heart rate, cardiac monitor, continuous pulse ox and blood pressure Approach: midline Location: L3-4 Injection technique: single-shot Needle Needle type: Pencan  Needle gauge: 24 G Needle length: 9 cm Needle insertion depth: 5 cm Assessment Sensory level: T4 Events: CSF return Additional Notes Patient tolerated procedure well. Adequate sensory level.

## 2021-03-04 NOTE — Lactation Note (Signed)
This note was copied from a baby's chart. Lactation Consultation Note  Patient Name: Melinda Bell [id number] Date: 03/04/2021 Reason for consult: Initial assessment;Term Age:33 hours, C/S delivery  LC entered the room, dad was doing skin to skin with infant, infant in calm alert state. Per mom, she feels breast feeding is going well, infant is sustaining latch and starting to breastfeed longer at the breast. Per dad, infant recently breastfeed for 35 minutes  on both breast at 1909 pm.  LC did not observe latch score due to infant recently breastfeeding. LC discussed infant's input and output with parents. LC informed parents after 24 hours of life infant may  start cluster feeding  on day 2  of life and this is normal infant feeding  behavior. Mom made aware of O/P services, breastfeeding support groups, community resources, and our phone # for post-discharge questions.   Mom's plan: 1- Mom will continue to breastfeed infant according to hunger cues,8 to 12 or more times within 24 hours, skin to skin. 2- Mom knows if infant is not latching well at breast to give back volume with hand expression and spoon and ask RN or LC for latch assistance.  Maternal Data Has patient been taught Hand Expression?: Yes Does the patient have breastfeeding experience prior to this delivery?: Yes How long did the patient breastfeed?: Per mom, she breastfeed twins for 3 months, they are currently 33 years old.  Feeding Mother's Current Feeding Choice: Breast Milk  LATCH Score                    Lactation Tools Discussed/Used    Interventions Interventions: Breast feeding basics reviewed;Skin to skin;Hand express;Breast compression;Education  Discharge Pump: Personal WIC Program: No  Consult Status Consult Status: Follow-up Date: 03/05/21 Follow-up type: In-patient    Vicente Serene 03/04/2021, 8:22 PM

## 2021-03-04 NOTE — Transfer of Care (Signed)
Immediate Anesthesia Transfer of Care Note  Patient: Melinda Bell  Procedure(s) Performed: CESAREAN SECTION  Patient Location: PACU  Anesthesia Type:Regional  Level of Consciousness: awake  Airway & Oxygen Therapy: Patient Spontanous Breathing  Post-op Assessment: Report given to RN  Post vital signs: Reviewed and stable  Last Vitals:  Vitals Value Taken Time  BP 113/67 03/04/21 0900  Temp    Pulse 78 03/04/21 0902  Resp 13 03/04/21 0902  SpO2 99 % 03/04/21 0902  Vitals shown include unvalidated device data.  Last Pain:  Vitals:   03/04/21 0544  TempSrc: Oral  PainSc: 0-No pain         Complications: No notable events documented.

## 2021-03-05 LAB — CBC
HCT: 30.5 % — ABNORMAL LOW (ref 36.0–46.0)
Hemoglobin: 10.1 g/dL — ABNORMAL LOW (ref 12.0–15.0)
MCH: 28.8 pg (ref 26.0–34.0)
MCHC: 33.1 g/dL (ref 30.0–36.0)
MCV: 86.9 fL (ref 80.0–100.0)
Platelets: 306 10*3/uL (ref 150–400)
RBC: 3.51 MIL/uL — ABNORMAL LOW (ref 3.87–5.11)
RDW: 14.6 % (ref 11.5–15.5)
WBC: 14.7 10*3/uL — ABNORMAL HIGH (ref 4.0–10.5)
nRBC: 0 % (ref 0.0–0.2)

## 2021-03-05 NOTE — Lactation Note (Signed)
This note was copied from a baby's chart. Lactation Consultation Note  Patient Name: Melinda Bell Date: 03/05/2021 Reason for consult: Follow-up assessment;Infant weight loss;Other (Comment);Term;Nipple pain/trauma (5 % weight loss /) Age:33 hours Per mom both nipples are sore , R>L . LC offered to assess and mom receptive  Right nipple has a bruise on the upper portion of the nipple, Left nipple clear, just pinky and sore. Areola edema noted even-though compressible.  Baby awake and LC guided mom through the steps for latching in the plan below and was able to have a comfortable feeding for 15 mins with swallows. When baby fell asleep , LC had mom release baby and there was some creasing in the mid section of the nipple. LC reviewed hand expressing, and had mom apply her milk and comfort gels.  LC reported to [place] plan below.  LC plan :  Feed with feeding cues ( 8-12 times in 24 hours )  Sore nipple tx plan - comfort gels after feedings/ alternating with shells while awake.  Reason for Pumping: due to sore nipples bilaterally ( right is sore and bruised the top of the nipple/ left just sore no breakdown with areola edema.prior to very feeding until sore ness has improved - breast massage, hand express , pre pump to prime the milk ducts and reverse pressure. latch with firm support and have dad ease the chin down until mom is comfortable. If the soreness isn't improving a DEBP should be set up  to enhance the milk coming in.  Call as needed.  Maternal Data Has patient been taught Hand Expression?: Yes  Feeding Mother's Current Feeding Choice: Breast Milk and Donor Milk  LATCH Score Latch: Grasps breast easily, tongue down, lips flanged, rhythmical sucking.  Audible Swallowing: Spontaneous and intermittent  Type of Nipple: Everted at rest and after stimulation  Comfort (Breast/Nipple): Filling, red/small blisters or bruises, mild/mod  discomfort  Hold (Positioning): Assistance needed to correctly position infant at breast and maintain latch.  LATCH Score: 8   Lactation Tools Discussed/Used Tools: Shells;Pump;Flanges;Comfort gels Flange Size: 21;24;Other (comment) (#21 accommodates the nipple / areola complex , but when the milk comes in probably the #24 F will fit better) Breast pump type: Manual Pump Education: Setup, frequency, and cleaning Reason for Pumping: due to sore nipples bilaterally ( right is sore and bruised the top of the nipple/ left just sore no breakdown with areola edema.prior to very feeding until sore ness has improved - breast massage, hand express , pre pump to prime the milk ducts and reverse pressure. latch with firm support and have dad ease the chin down until mom is comfortable.  Interventions Interventions: Breast feeding basics reviewed;Assisted with latch;Skin to skin;Breast massage;Hand express;Reverse pressure;Pre-pump if needed;Breast compression;Adjust position;Support pillows;Position options;Comfort gels;Shells;Hand pump;Education  Discharge Pump: Manual  Consult Status Consult Status: Follow-up Date: 03/06/21 Follow-up type: In-patient    [place] 03/05/2021, 1:32 PM

## 2021-03-05 NOTE — Lactation Note (Signed)
This note was copied from a baby's chart. Lactation Consultation Note  Patient Name: Melinda Bell [id number] Date: 03/05/2021 Reason for consult: Follow-up assessment;Term;Infant weight loss;Other (Comment) (5 % weight loss / attempted to see mom the SSW into see mom will F/U.) Age:33 hours  Maternal Data    Feeding Mother's Current Feeding Choice: Breast Milk  LATCH Score  Lactation Tools Discussed/Used    Interventions    Discharge    Consult Status Consult Status: Follow-up Date: 03/05/21 Follow-up type: In-patient    [place] 03/05/2021, 12:27 PM

## 2021-03-05 NOTE — Social Work (Signed)
CSW received consult for hx of Anxiety and Postpartum Depression.  CSW met with MOB to offer support and complete assessment.     CSW introduced self and role. CSW observed FOB present, holding infant 'Clay'. CSW offered to return to speak in private, however MOB declined and stated FOB could remain in the room. CSW informed MOB of reason for consult and assessed current emotions. MOB reported she is feeling good and had a pretty good pregnancy. MOB disclosed she was diagnosed with anxiety in 2019 and experienced PPD in 2013 or 2014, which presented immediately as a lack of energy and being overwhelmed. MOB stated the feelings lasted about 6 months and passed on their own. MOB shared she took medication, however she did not like the side effects. MOB reported she has been in therapy with [facility] for 4 years, and she attends every other week. MOB shared she finds therapy to be helpful. MOB identified friends and family as supports. MOB denies any current SI or HI.  CSW provided education regarding the baby blues period versus PPD and provided resources. CSW provided the New Mom Checklist and encouraged MOB to self evaluate and contact a medical professional if symptoms are noted at any time.   CSW provided review of Sudden Infant Death Syndrome (SIDS) precautions. MOB reported she has all infant needs, including a car seat and bassinet. MOB identified [facility] for follow-up care and denies any barriers to care. MOB stated she has no additional needs at this time.    CSW identifies no further need for intervention and no barriers to discharge at this time.  Darra Lis, [employer] [phone]

## 2021-03-05 NOTE — Progress Notes (Signed)
Subjective: Postpartum Day 1: Cesarean Delivery Patient reports doing well, tolerating PO, ambulating, voiding and +flatus.   Objective: Vitals:   03/04/21 1700 03/04/21 2051 03/05/21 0240 03/05/21 0650  BP:  140/85 117/78 109/75  Pulse:  97 61 62  Resp:      Temp: 97.6 F (36.4 C) 98.3 F (36.8 C) 97.9 F (36.6 C) 98 F (36.7 C)  TempSrc: Oral Oral Oral Tympanic  SpO2: 99% 99% 100% 100%  Weight:      Height:       Physical Exam:  General: alert and no distress Lochia: appropriate Uterine Fundus: firm Incision: dressing in place, prior area with saturation is now dry and not expanding. DVT Evaluation: No evidence of DVT seen on physical exam.  Recent Labs    03/02/21 1037 03/05/21 0544  HGB 11.2* 10.1*  HCT 34.8* 30.5*    Assessment/Plan: Status post Cesarean section. Doing well postoperatively.  Continue current care. JONETTE WASSEL [id number] POD#1 sp CS 22w1d1. PPC: Routine postop care.  Hgb 11.2>10.1, EBL 167cc. Blood type A POS, breastfeeding, baby boy in room. Vaccines: tdap given in pregnancy. Declines COVID vaccines 2. Depression: seen by SW, patient follows with counseling 3. Rubella nonimmune - discussed MMR, accepts 4. Desires neonatal circumcision, R/B/A of procedure discussed at length. Pt understands that neonatal circumcision is not considered medically necessary and is elective. The risks include, but are not limited to bleeding, infection, damage to the penis, development of scar tissue, and having to have it redone at a later date. Pt understands theses risks and wishes to proceed. Will plan for circumcision this afternoon vs. Day of discharge  Dreshaun Stene K Taam-Akelman 03/05/2021, 9:16 AM

## 2021-03-06 LAB — BIRTH TISSUE RECOVERY COLLECTION (PLACENTA DONATION)

## 2021-03-06 MED ORDER — OXYCODONE-ACETAMINOPHEN 5-325 MG PO TABS: 1.0000 | ORAL_TABLET | ORAL | 0 refills | Status: DC | PRN

## 2021-03-06 NOTE — Progress Notes (Addendum)
  Patient is eating, ambulating, voiding.  Pain control is good.  Vitals:   03/05/21 0650 03/05/21 1428 03/05/21 2110 03/06/21 0513  BP: 109/75 110/80 101/68 125/77  Pulse: 62 66 76   Resp:  _0 Temp: 98 F (36.7 C) 98.2 F (36.8 C) 98.5 F (36.9 C) 97.8 F (36.6 C)  TempSrc: Tympanic Oral Oral Oral  SpO2: 100%  100% 100%  Weight:      Height:        lungs:   clear to auscultation cor:    RRR Abdomen:  soft, appropriate tenderness, incisions intact and without erythema or exudate ex:    no cords   Lab Results  Component Value Date   WBC 14.7 (H) 03/05/2021   HGB 10.1 (L) 03/05/2021   HCT 30.5 (L) 03/05/2021   MCV 86.9 03/05/2021   PLT 306 03/05/2021    --/--/A POS (08/08 1048)/RNI  A/P    Post operative day 2.  Routine post op and postpartum care.  Expect d/c today. Percocet for pain control.   Parents desires circumsision BUT peds worried about anatomy- NO circ.  MMR given.

## 2021-03-06 NOTE — Discharge Summary (Signed)
   Postpartum Discharge Summary  Date of Service updated      Patient Name: Melinda Bell DOB: 11/24/1985 MRN: [id number]  Date of admission: 03/04/2021 Delivery date:03/04/2021  Delivering provider: HORVATH, MICHELLE  Date of discharge: 03/06/2021  Admitting diagnosis: History of cesarean delivery [Z98.891] Intrauterine pregnancy: [redacted]w[redacted]d     Secondary diagnosis:  Active Problems:   History of cesarean delivery  Additional problems: none    Discharge diagnosis: Term Pregnancy Delivered                                              Post partum procedures: MMR Augmentation: N/A Complications: None  Hospital course: Sceduled C/S   35 y.o. yo G2P2003 at [redacted]w[redacted]d was admitted to the hospital 03/04/2021 for scheduled cesarean section with the following indication:Elective Repeat.Delivery details are as follows:  Membrane Rupture Time/Date: 8:17 AM ,03/04/2021   Delivery Method:C-Section, Vacuum Assisted  Details of operation can be found in separate operative note.  Patient had an uncomplicated postpartum course.  She is ambulating, tolerating a regular diet, passing flatus, and urinating well. Patient is discharged home in stable condition on  03/06/21        Newborn Data: Birth date:03/04/2021  Birth time:8:18 AM  Gender:Female  Living status:Living  Apgars:8 ,9  Weight:3555 g     Magnesium Sulfate received: No BMZ received: No Rhophylac:No MMR:Yes T-DaP:Given prenatally Flu: No Transfusion:No  Physical exam  Vitals:   03/05/21 0650 03/05/21 1428 03/05/21 2110 03/06/21 0513  BP: 109/75 110/80 101/68 125/77  Pulse: 62 66 76   Resp:  18 16 16  Temp: 98 F (36.7 C) 98.2 F (36.8 C) 98.5 F (36.9 C) 97.8 F (36.6 C)  TempSrc: Tympanic Oral Oral Oral  SpO2: 100%  100% 100%  Weight:      Height:        Labs: Lab Results  Component Value Date   WBC 14.7 (H) 03/05/2021   HGB 10.1 (L) 03/05/2021   HCT 30.5 (L) 03/05/2021   MCV 86.9 03/05/2021   PLT 306 03/05/2021    CMP Latest Ref Rng & Units 10/24/2017  Glucose 70 - 99 mg/dL 94  BUN 6 - 23 mg/dL 13  Creatinine 0.40 - 1.20 mg/dL 0.71  Sodium 135 - 145 mEq/L 139  Potassium 3.5 - 5.1 mEq/L 4.4  Chloride 96 - 112 mEq/L 102  CO2 19 - 32 mEq/L 29  Calcium 8.4 - 10.5 mg/dL 10.3  Total Protein 6.0 - 8.3 g/dL 7.6  Total Bilirubin 0.2 - 1.2 mg/dL 0.3  Alkaline Phos 39 - 117 U/L 62  AST 0 - 37 U/L 14  ALT 0 - 35 U/L 9   Edinburgh Score: Edinburgh Postnatal Depression Scale Screening Tool 03/05/2021  I have been able to laugh and see the funny side of things. 0  I have looked forward with enjoyment to things. 0  I have blamed myself unnecessarily when things went wrong. 1  I have been anxious or worried for no good reason. 2  I have felt scared or panicky for no good reason. 0  Things have been getting on top of me. 2  I have been so unhappy that I have had difficulty sleeping. 1  I have felt sad or miserable. 1  I have been so unhappy that I have been crying. 1  The thought of harming myself   has occurred to me. 0  Edinburgh Postnatal Depression Scale Total 8      After visit meds:  Allergies as of 03/06/2021       Reactions   Penicillins Rash   REACTION: Childhood        Medication List     STOP taking these medications    valACYclovir 500 MG tablet Commonly known as: VALTREX       TAKE these medications    oxyCODONE-acetaminophen 5-325 MG tablet Commonly known as: PERCOCET/ROXICET Take 1-2 tablets by mouth every 4 (four) hours as needed for moderate pain.   PNV Prenatal Plus Multivitamin 27-1 MG Tabs Take 1 tablet by mouth daily.               Discharge Care Instructions  (From admission, onward)           Start     Ordered   03/06/21 0000  Discharge wound care:       Comments: Sitz baths and icepacks to perineum.  If stitches, they will dissolve.   03/06/21 0745             Discharge home in stable condition Infant Feeding: Breast Infant  Disposition:home with mother Discharge instruction: per After Visit Summary and Postpartum booklet. Activity: Advance as tolerated. Pelvic rest for 6 weeks.  Diet: routine diet Anticipated Birth Control: Unsure Postpartum Appointment:4 weeks Additional Postpartum F/U:  none Future Appointments:No future appointments. Follow up Visit:  Follow-up Information     Bobbye Charleston, MD Follow up in 4 week(s).   Specialty: Obstetrics and Gynecology Contact information: [address] [place]. [place] [address] [phone]                     03/06/2021 Daria Pastures, MD

## 2021-03-07 ENCOUNTER — Ambulatory Visit: Payer: Self-pay

## 2021-03-07 NOTE — Lactation Note (Signed)
This note was copied from a baby's chart. Lactation Consultation Note  Patient Name: Melinda Bell [id number] Date: 03/07/2021 Reason for consult: Follow-up assessment;Term;Other (Comment) (gained 50 gm since yesterday. per mom milk is in and baby recently fed 15 mins and softened the breast. '@consult'$  dad changed a wet + stool / mom attempted to feed baby noy interested. LC reviewd BF D/C teaching - see below. per mom SN's better.) Age:33 hours LC reviewed the doc flow sheets with mom and dad/ WNL.  Mom has the [facility] brochure with BF resources/ and BFSG .   Maternal Data Has patient been taught Hand Expression?: Yes  Feeding Mother's Current Feeding Choice: Breast Milk  LATCH Score                    Lactation Tools Discussed/Used Tools: Shells;Comfort gels;Pump;Flanges Flange Size: 21;24 Breast pump type: Manual Pump Education: Milk Storage  Interventions Interventions: Breast feeding basics reviewed;Education;Comfort gels;Shells;Hand pump  Discharge Discharge Education: Engorgement and breast care Pump: Manual;Personal;DEBP  Consult Status Consult Status: Complete Date: 03/07/21    [name] 03/07/2021, 11:35 AM

## 2021-03-09 ENCOUNTER — Telehealth: Payer: Self-pay

## 2021-03-09 NOTE — Telephone Encounter (Signed)
Transition Care Management Unsuccessful Follow-up Telephone Call  Date of discharge and from where:  03/06/2021-[facility]  Attempts:  1st Attempt  Reason for unsuccessful TCM follow-up call:  Left voice message

## 2021-03-10 NOTE — Telephone Encounter (Signed)
Transition Care Management Follow-up Telephone Call Date of discharge and from where: 03/06/2021-[facility] How have you been since you were released from the hospital? Patient stated she is doing good.  Any questions or concerns? No  Items Reviewed: Did the pt receive and understand the discharge instructions provided? Yes  Medications obtained and verified? Yes  Other? No  Any new allergies since your discharge? No  Dietary orders reviewed? N/A Do you have support at home? Yes   Home Care and Equipment/Supplies: Were home health services ordered? not applicable If so, what is the name of the agency? N/A  Has the agency set up a time to come to the patient's home? not applicable Were any new equipment or medical supplies ordered?  No What is the name of the medical supply agency? N/A Were you able to get the supplies/equipment? not applicable Do you have any questions related to the use of the equipment or supplies? No  Functional Questionnaire: (I = Independent and D = Dependent) ADLs: I  Bathing/Dressing- I  Meal Prep- I  Eating- I  Maintaining continence- I  Transferring/Ambulation- I  Managing Meds- I  Follow up appointments reviewed:  PCP Hospital f/u appt confirmed? No   Specialist Hospital f/u appt confirmed? No   Are transportation arrangements needed? No  If their condition worsens, is the pt aware to call PCP or go to the Emergency Dept.? Yes Was the patient provided with contact information for the PCP's office or ED? Yes Was to pt encouraged to call back with questions or concerns? Yes

## 2021-04-16 DIAGNOSIS — Z3202 Encounter for pregnancy test, result negative: Secondary | ICD-10-CM | POA: Diagnosis not present

## 2021-04-16 DIAGNOSIS — N854 Malposition of uterus: Secondary | ICD-10-CM | POA: Diagnosis not present

## 2021-04-16 DIAGNOSIS — Z30430 Encounter for insertion of intrauterine contraceptive device: Secondary | ICD-10-CM | POA: Diagnosis not present

## 2021-07-28 ENCOUNTER — Telehealth: Payer: Medicaid Other | Admitting: Internal Medicine

## 2021-12-01 ENCOUNTER — Ambulatory Visit (INDEPENDENT_AMBULATORY_CARE_PROVIDER_SITE_OTHER): Payer: Medicaid Other | Admitting: Family Medicine

## 2021-12-01 ENCOUNTER — Encounter: Payer: Self-pay | Admitting: Family Medicine

## 2021-12-01 VITALS — BP 108/72 | HR 63 | Temp 98.5°F | Wt 105.1 lb

## 2021-12-01 DIAGNOSIS — J019 Acute sinusitis, unspecified: Secondary | ICD-10-CM

## 2021-12-01 MED ORDER — CEFUROXIME AXETIL 500 MG PO TABS: 500.0000 mg | ORAL_TABLET | Freq: Two times a day (BID) | ORAL | 0 refills | Status: AC

## 2021-12-01 NOTE — Progress Notes (Signed)
? ?  Subjective:  ? ? Patient ID: DAVA RENSCH, female    DOB: Oct 10, 1985, 36 y.o.   MRN: [id number] ? ?HPI ?Here for 4 days of sinus congestion, left era pain, PND, ST, and a dry cough. No fever. She has tried Sudafed and Zycam with no relief. She is currently breast feeding.  ? ? ?Review of Systems  ?Constitutional: Negative.   ?HENT:  Positive for congestion, ear pain, postnasal drip, sinus pressure and sore throat. Negative for facial swelling.   ?Eyes: Negative.   ?Respiratory:  Positive for cough. Negative for shortness of breath and wheezing.   ? ?   ?Objective:  ? Physical Exam ?Constitutional:   ?   Appearance: Normal appearance. She is not ill-appearing.  ?HENT:  ?   Right Ear: Tympanic membrane, ear canal and external ear normal.  ?   Left Ear: Tympanic membrane, ear canal and external ear normal.  ?   Nose: Nose normal.  ?   Mouth/Throat:  ?   Pharynx: Oropharynx is clear.  ?Eyes:  ?   Conjunctiva/sclera: Conjunctivae normal.  ?Pulmonary:  ?   Effort: Pulmonary effort is normal.  ?   Breath sounds: Normal breath sounds.  ?Lymphadenopathy:  ?   Cervical: No cervical adenopathy.  ?Neurological:  ?   Mental Status: She is alert.  ? ? ? ? ? ?   ?Assessment & Plan:  ?Sinusitis, treat with 10 days of Ceftin.  ?Alysia Penna, MD ? ? ?

## 2022-05-17 ENCOUNTER — Ambulatory Visit (INDEPENDENT_AMBULATORY_CARE_PROVIDER_SITE_OTHER): Payer: BC Managed Care – PPO | Admitting: Family Medicine

## 2022-05-17 ENCOUNTER — Encounter: Payer: Self-pay | Admitting: Family Medicine

## 2022-05-17 VITALS — BP 98/62 | HR 65 | Temp 97.9°F | Wt 109.0 lb

## 2022-05-17 DIAGNOSIS — J019 Acute sinusitis, unspecified: Secondary | ICD-10-CM | POA: Diagnosis not present

## 2022-05-17 DIAGNOSIS — H6692 Otitis media, unspecified, left ear: Secondary | ICD-10-CM | POA: Diagnosis not present

## 2022-05-17 MED ORDER — CEFUROXIME AXETIL 500 MG PO TABS: 500.0000 mg | ORAL_TABLET | Freq: Two times a day (BID) | ORAL | 0 refills | Status: AC

## 2022-05-17 MED ORDER — METHYLPREDNISOLONE 4 MG PO TBPK: ORAL_TABLET | ORAL | 0 refills | Status: DC

## 2022-05-17 NOTE — Progress Notes (Signed)
   Subjective:    Patient ID: Melinda Bell, female    DOB: Dec 10, 1985, 36 y.o.   MRN: [id number]  HPI Here for 2 weeks of sinus pressure, PND, and a dry cough. Now for several days she has pain in the left ear. No fever. Taking Sudafed. She is breast feeding.    Review of Systems  Constitutional: Negative.   HENT:  Positive for congestion, ear pain, postnasal drip and sinus pressure. Negative for sore throat.   Eyes: Negative.   Respiratory:  Positive for cough. Negative for shortness of breath and wheezing.        Objective:   Physical Exam Constitutional:      Appearance: Normal appearance.  HENT:     Right Ear: Tympanic membrane, ear canal and external ear normal.     Left Ear: Ear canal and external ear normal.     Ears:     Comments: Left TM is red and dull     Nose: Nose normal.     Mouth/Throat:     Pharynx: Oropharynx is clear.  Eyes:     Conjunctiva/sclera: Conjunctivae normal.  Pulmonary:     Effort: Pulmonary effort is normal.     Breath sounds: Normal breath sounds.  Lymphadenopathy:     Cervical: No cervical adenopathy.  Neurological:     Mental Status: She is alert.           Assessment & Plan:  Sinusitis, treat with 10 days of Cefuroxime and a Medrol dose pack.  Alysia Penna, MD

## 2022-05-30 ENCOUNTER — Encounter: Payer: Self-pay | Admitting: Family Medicine

## 2022-05-31 ENCOUNTER — Other Ambulatory Visit: Payer: Self-pay

## 2022-05-31 DIAGNOSIS — J019 Acute sinusitis, unspecified: Secondary | ICD-10-CM

## 2022-05-31 MED ORDER — AZITHROMYCIN 250 MG PO TABS
ORAL_TABLET | ORAL | 0 refills | Status: AC
Start: 2022-05-31 — End: 2022-06-05

## 2022-05-31 NOTE — Telephone Encounter (Signed)
Call in a [facility] (tell her this is safe while she is breastfeeding)

## 2022-06-29 ENCOUNTER — Ambulatory Visit (INDEPENDENT_AMBULATORY_CARE_PROVIDER_SITE_OTHER): Payer: BC Managed Care – PPO | Admitting: Family Medicine

## 2022-06-29 ENCOUNTER — Encounter: Payer: Self-pay | Admitting: Family Medicine

## 2022-06-29 VITALS — BP 100/66 | HR 75 | Temp 97.5°F | Ht 64.0 in | Wt 114.1 lb

## 2022-06-29 DIAGNOSIS — H938X3 Other specified disorders of ear, bilateral: Secondary | ICD-10-CM | POA: Diagnosis not present

## 2022-06-29 NOTE — Patient Instructions (Signed)
Consider humidifier for night time use.   I am setting up ENT referral.
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
## 2023-08-02 ENCOUNTER — Telehealth: Payer: Self-pay | Admitting: Internal Medicine

## 2023-08-02 NOTE — Telephone Encounter (Signed)
 Called pt to sch physical, left vm. Pls sch pt for appt.

## 2023-09-20 ENCOUNTER — Ambulatory Visit: Payer: Medicaid Other | Admitting: Family Medicine

## 2023-09-20 ENCOUNTER — Ambulatory Visit: Payer: Self-pay | Admitting: Internal Medicine

## 2023-09-20 VITALS — BP 120/80 | HR 70 | Temp 97.8°F | Ht 64.0 in | Wt 121.9 lb

## 2023-09-20 DIAGNOSIS — R059 Cough, unspecified: Secondary | ICD-10-CM

## 2023-09-20 DIAGNOSIS — G43909 Migraine, unspecified, not intractable, without status migrainosus: Secondary | ICD-10-CM

## 2023-09-20 LAB — POCT INFLUENZA A/B
Influenza A, POC: NEGATIVE
Influenza B, POC: NEGATIVE

## 2023-09-20 LAB — POC COVID19 BINAXNOW: SARS Coronavirus 2 Ag: NEGATIVE

## 2023-09-20 MED ORDER — PREDNISONE 20 MG PO TABS: ORAL_TABLET | ORAL | 0 refills | Status: DC
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
## 2023-09-20 NOTE — Telephone Encounter (Signed)
 Chief Complaint: cough, migraine Symptoms: pain, congestion, cough, headache Frequency: 5 days Pertinent Negatives: Patient denies NVD, fever, SOB Disposition: [] ED /[] Urgent Care (no appt availability in office) / [x] Appointment(In office/virtual)/ []  [facility]/ [] Home Care/ [] Refused Recommended Disposition /[] [facility]/ []  Follow-up with PCP Additional Notes: Patient calls reporting productive cough x 5 days and migraine x 4days. States she has taken prescribed medications for migraine with minimal relief, but is now out of the medication, awaiting refill. Per protocol, patient to be evaluated within 24 hours. First available appointment with PCP outside of guideline. Patient scheduled with first available provider in clinic for today at 1515 at patient request. Care advice reviewed, patient verbalized understandingand denies further questions at this time. Alerting PCP for review.    Reason for Disposition  [1] MODERATE headache (e.g., interferes with normal activities) AND [2] present > 24 hours AND [3] unexplained  (Exceptions: analgesics not tried, typical migraine, or headache part of viral illness)  Answer Assessment - Initial Assessment Questions 1. ONSET: "When did the cough begin?"      5 days 2. SEVERITY: "How bad is the cough today?"      Persistent  3. SPUTUM: "Describe the color of your sputum" (none, dry cough; clear, white, yellow, green)     Unsure, not spitting out. 4. HEMOPTYSIS: "Are you coughing up any blood?" If so ask: "How much?" (flecks, streaks, tablespoons, etc.)     Denies 5. DIFFICULTY BREATHING: "Are you having difficulty breathing?" If Yes, ask: "How bad is it?" (e.g., mild, moderate, severe)    - MILD: No SOB at rest, mild SOB with walking, speaks normally in sentences, can lie down, no retractions, pulse < 100.    - MODERATE: SOB at rest, SOB with minimal exertion and prefers to sit, cannot lie down flat, speaks in phrases, mild  retractions, audible wheezing, pulse 100-120.    - SEVERE: Very SOB at rest, speaks in single words, struggling to breathe, sitting hunched forward, retractions, pulse > 120      Denies 6. FEVER: "Do you have a fever?" If Yes, ask: "What is your temperature, how was it measured, and when did it start?"     Denies 7. CARDIAC HISTORY: "Do you have any history of heart disease?" (e.g., heart attack, congestive heart failure)      Denies 8. LUNG HISTORY: "Do you have any history of lung disease?"  (e.g., pulmonary embolus, asthma, emphysema)     Denies 9. PE RISK FACTORS: "Do you have a history of blood clots?" (or: recent major surgery, recent prolonged travel, bedridden)     Denies 10. OTHER SYMPTOMS: "Do you have any other symptoms?" (e.g., runny nose, wheezing, chest pain)       Congestion, cough, migraine 11. PREGNANCY: "Is there any chance you are pregnant?" "When was your last menstrual period?"       Denies 12. TRAVEL: "Have you traveled out of the country in the last month?" (e.g., travel history, exposures)       Denies  Answer Assessment - Initial Assessment Questions 1. LOCATION: "Where does it hurt?"      Behind R eye 2. ONSET: "When did the headache start?" (Minutes, hours or days)      4 days 3. PATTERN: "Does the pain come and go, or has it been constant since it started?"     Constant 4. SEVERITY: "How bad is the pain?" and "What does it keep you from doing?"  (e.g., Scale 1-10; mild,  moderate, or severe)   - MILD (1-3): doesn't interfere with normal activities    - MODERATE (4-7): interferes with normal activities or awakens from sleep    - SEVERE (8-10): excruciating pain, unable to do any normal activities        6/10 5. RECURRENT SYMPTOM: "Have you ever had headaches before?" If Yes, ask: "When was the last time?" and "What happened that time?"      Yes, has prescription for migraines, but is currently out. 6. CAUSE: "What do you think is causing the headache?"      Migraine 7. MIGRAINE: "Have you been diagnosed with migraine headaches?" If Yes, ask: "Is this headache similar?"      Yes 8. HEAD INJURY: "Has there been any recent injury to the head?"      Denies 9. OTHER SYMPTOMS: "Do you have any other symptoms?" (fever, stiff neck, eye pain, sore throat, cold symptoms)     R ee pain,  cough, congestion 10. PREGNANCY: "Is there any chance you are pregnant?" "When was your last menstrual period?"       Denies  Protocols used: Cough - Acute Productive-A-AH, Headache-A-AH

## 2024-04-02 ENCOUNTER — Encounter: Payer: Self-pay | Admitting: Family Medicine

## 2024-04-02 ENCOUNTER — Ambulatory Visit: Admitting: Family Medicine

## 2024-04-02 VITALS — BP 92/62 | HR 70 | Temp 98.4°F | Ht 64.0 in | Wt 127.2 lb

## 2024-04-02 DIAGNOSIS — G43009 Migraine without aura, not intractable, without status migrainosus: Secondary | ICD-10-CM | POA: Diagnosis not present

## 2024-04-02 MED ORDER — NURTEC 75 MG PO TBDP: 1.0000 | ORAL_TABLET | ORAL | 2 refills | Status: DC

## 2024-04-02 NOTE — Progress Notes (Signed)
 Established Patient Office Visit  Subjective   Patient ID: Melinda Bell, female    DOB: 1985/11/29  Age: 38 y.o. MRN: [id number]  Chief Complaint  Patient presents with   Migraine    Patient complains of increased frequency of headaches for the past month, had a total of 8, pain now centered around the right eye and twitching noted in the eye at onset    Migraine    Discussed the use of AI scribe software for clinical note transcription with the patient, who gave verbal consent to proceed.  Discussed the use of AI scribe software for clinical note transcription with the patient, who gave verbal consent to proceed.  History of Present Illness   Melinda Bell is a 38 year old female who presents with worsening migraines.  She has been experiencing migraines that have worsened over the past year or two, beginning after the birth of her children, who are currently almost twelve and three years old. The migraines occur behind her right eye, radiating into her temple, and are sometimes accompanied by nausea and vomiting. During these episodes, she needs to lay down.  Over the past month, she tracked eight migraine episodes in thirty days. The migraines are typically triggered hormonally, occurring around her menstrual cycle, particularly in the last two days. She has attempted dietary changes, such as cutting out dairy, without success in reducing migraine frequency or severity.  Her current treatment includes sumatriptan , which she has been using for several years. It is effective in aborting the migraines if taken promptly and followed by rest, usually resolving the headache within an hour. She has not tried any other medications for migraine prevention.  She is currently using a NuvaRing for hormonal management and has discontinued norethindrone . No vision changes, numbness, or tingling associated with her migraines. She also reports sensitivity to light during migraine  episodes.       Current Outpatient Medications  Medication Instructions   Nurtec 75 mg, Oral, Every other day   NUVARING 0.12-0.015 MG/24HR vaginal ring Place vaginally.    Patient Active Problem List   Diagnosis Date Noted   History of cesarean delivery 03/04/2021   Oral contraceptive use 12/19/2018   Migraine without aura and without status migrainosus, not intractable 12/19/2018   URI (upper respiratory infection) 07/24/2018   Abnormal ear exam 10/02/2015   Atypical mole ? right buttocks. 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     Objective:     BP 92/62   Pulse 70   Temp 98.4 F (36.9 C) (Oral)   Ht 5' 4 (1.626 m)   Wt 127 lb 3.2 oz (57.7 kg)   LMP 03/12/2024 (Exact Date)   SpO2 99%   BMI 21.83 kg/m    Physical Exam Vitals reviewed.  Constitutional:      Appearance: Normal appearance. She is normal weight.  Neck:     Thyroid : No thyromegaly.  Cardiovascular:     Rate and Rhythm: Normal rate and regular rhythm.     Heart sounds: Normal heart sounds.  Pulmonary:     Effort: Pulmonary effort is normal.     Breath  sounds: Normal breath sounds. No wheezing.  Neurological:     Mental Status: She is alert.      No results found for any visits on 04/02/24.    The ASCVD Risk score (Arnett DK, et al., 2019) failed to calculate for the following reasons:   The 2019 ASCVD risk score is only valid for ages 29 to 67    Assessment & Plan:  Migraine without aura and without status migrainosus, not intractable -     Nurtec; Take 1 tablet (75 mg total) by mouth every other day.  Dispense: 15 tablet; Refill: 2    Assessment and Plan    Migraine  without aura, right-sided, recurrent Recurrent migraines, hormonally triggered, treated with sumatriptan . Seeking preventative treatment. - Prescribed Nurtec every other day or as needed. - Discontinued sumatriptan  due to potential interactions. - Instructed use of coupon card for Nurtec to reduce copay. - Advised monitoring effectiveness and side effects of Nurtec, report via MyChart. - Consider amitriptyline  if Nurtec ineffective.         Return for annual physical exam with Dr. Charlett.    Heron CHRISTELLA Sharper, MD

## 2024-04-04 ENCOUNTER — Telehealth: Payer: Self-pay

## 2024-04-04 ENCOUNTER — Other Ambulatory Visit (HOSPITAL_COMMUNITY): Payer: Self-pay

## 2024-04-04 NOTE — Telephone Encounter (Signed)
 Pharmacy Patient Advocate Encounter   Received notification from Onbase that prior authorization for Nurtec 75 is required/requested.   Insurance verification completed.   The patient is insured through [employer] [facility] .   Per test claim: PA required; PA submitted to above mentioned insurance via Latent Key/confirmation #/[id number] [employer] Status is pending

## 2024-04-05 ENCOUNTER — Other Ambulatory Visit (HOSPITAL_COMMUNITY): Payer: Self-pay

## 2024-04-05 NOTE — Telephone Encounter (Signed)
 Spoke with Heather , pharmacist at [facility] and informed her of the approval as below.

## 2024-04-05 NOTE — Telephone Encounter (Signed)
 Pharmacy Patient Advocate Encounter  Received notification from [facility] that Prior Authorization for Nurtec 75 has been APPROVED from 04/04/24 to 04/04/25. Ran test claim, Copay is $0.00. This test claim was processed through [facility]- copay amounts may vary at other pharmacies due to pharmacy/plan contracts, or as the patient moves through the different stages of their insurance plan.   PA #/Case ID/Reference #: [id number]

## 2024-06-25 ENCOUNTER — Encounter: Payer: Self-pay | Admitting: Family Medicine

## 2024-06-25 ENCOUNTER — Ambulatory Visit: Admitting: Family Medicine

## 2024-06-25 ENCOUNTER — Ambulatory Visit: Payer: Self-pay

## 2024-06-25 VITALS — BP 118/76 | HR 74 | Temp 97.9°F | Ht 64.0 in | Wt 130.8 lb

## 2024-06-25 DIAGNOSIS — G43009 Migraine without aura, not intractable, without status migrainosus: Secondary | ICD-10-CM | POA: Diagnosis not present

## 2024-06-25 DIAGNOSIS — H66002 Acute suppurative otitis media without spontaneous rupture of ear drum, left ear: Secondary | ICD-10-CM | POA: Diagnosis not present

## 2024-06-25 MED ORDER — NURTEC 75 MG PO TBDP: 1.0000 | ORAL_TABLET | ORAL | 5 refills | Status: AC

## 2024-06-25 MED ORDER — CEFDINIR 300 MG PO CAPS: 300.0000 mg | ORAL_CAPSULE | Freq: Two times a day (BID) | ORAL | 0 refills | Status: AC

## 2024-06-25 NOTE — Progress Notes (Signed)
 Acute Office Visit  Subjective:     Patient ID: Melinda Bell, female    DOB: November 22, 1985, 38 y.o.   MRN: [id number]  Chief Complaint  Patient presents with   Sinus Problem    Patient complains of sinus pressure, nasal congestion, pressure noted in the left ear-congestion right ear x1 day, tried Dayquil with no relief    Sinus Problem   Pt is reporting 1 day history of sinus pressure  Discussed the use of AI scribe software for clinical note transcription with the patient, who gave verbal consent to proceed.  History of Present Illness   CHELLE Bell is a 38 year old female who presents with upper respiratory symptoms.  She developed symptoms yesterday with slight sore throat, left ear fullness, and heavy nasal drainage and mucus. She feels cold but has had no fever.  She tends to get sinus and ear infections and previously had ear tubes placed twice. She is allergic to penicillins.  She takes Nurtec every other day for migraines. Her headaches recur if she misses doses, and insurance limits the quantity she can obtain at one time.       Review of Systems  All other systems reviewed and are negative.       Objective:    BP 118/76   Pulse 74   Temp 97.9 F (36.6 C) (Oral)   Ht 5' 4 (1.626 m)   Wt 130 lb 12.8 oz (59.3 kg)   LMP 05/24/2024 (Approximate)   SpO2 98%   BMI 22.45 kg/m    Physical Exam Vitals reviewed.  Constitutional:      Appearance: Normal appearance. She is normal weight.  HENT:     Right Ear: Tympanic membrane normal.     Left Ear: Tympanic membrane is erythematous and retracted.     Nose: Congestion present.     Right Sinus: No maxillary sinus tenderness.     Left Sinus: No maxillary sinus tenderness.     Mouth/Throat:     Mouth: Mucous membranes are moist.     Pharynx: Posterior oropharyngeal erythema present. No oropharyngeal exudate.  Cardiovascular:     Rate and Rhythm: Normal rate and regular rhythm.  Pulmonary:      Effort: Pulmonary effort is normal.     Breath sounds: Normal breath sounds. No wheezing, rhonchi or rales.  Musculoskeletal:     Cervical back: Normal range of motion.  Lymphadenopathy:     Cervical: No cervical adenopathy.  Neurological:     Mental Status: She is alert.     No results found for any visits on 06/25/24.      Assessment & Plan:   Problem List Items Addressed This Visit       Unprioritized   Migraine without aura and without status migrainosus, not intractable   Relevant Medications   Rimegepant Sulfate (NURTEC) 75 MG TBDP   Other Visit Diagnoses       Non-recurrent acute suppurative otitis media of left ear without spontaneous rupture of tympanic membrane    -  Primary   Relevant Medications   cefdinir (OMNICEF) 300 MG capsule      Assessment and Plan    Acute left otitis media Confirmed by examination, with scarring and previous tympanostomy tube placement. No fever, but significant nasal drainage and ear fullness. Allergic to penicillins. - Prescribed Omnicef (cefdinir), one capsule twice daily for 10 days. - Advised to complete at least 7 days of antibiotics, with the option to stop  early if symptoms resolve after 7 days.  Migraine Chronic migraines managed with Nurtec, taken every other day. Insurance may impose a quantity limit, restricting prescription to 8 tablets per month despite a prescription for 15 tablets. - Sent a new prescription for Nurtec with a note to the pharmacist requesting 15 tablets per month. - Advised to discuss with the pharmacist about the quantity limit and to request a prior authorization if necessary.       Meds ordered this encounter  Medications   cefdinir (OMNICEF) 300 MG capsule    Sig: Take 1 capsule (300 mg total) by mouth 2 (two) times daily for 10 days.    Dispense:  20 capsule    Refill:  0   Rimegepant Sulfate (NURTEC) 75 MG TBDP    Sig: Take 1 tablet (75 mg total) by mouth every other day.    Dispense:   15 tablet    Refill:  5    Patient needs to take the medication EVERY OTHER DAY so she needs 15 tablets in a month supply-- please send a prior authorization if this exceeds the quantity limit.    No follow-ups on file.  Heron CHRISTELLA Sharper, MD

## 2024-06-25 NOTE — Telephone Encounter (Signed)
 FYI Only or Action Required?: FYI only for provider: appointment scheduled on 06/25/2024 at 4:20 PM.  Patient was last seen in primary care on 04/02/2024 by Ozell Heron HERO, MD.  Called Nurse Triage reporting Nasal Congestion.  Symptoms began yesterday.  Interventions attempted: Rest, hydration, or home remedies.  Symptoms are: unchanged.  Triage Disposition: See Physician Within 24 Hours  Patient/caregiver understands and will follow disposition?: Yes  Copied from CRM #[id number]. Topic: Clinical - Red Word Triage >> Jun 25, 2024  9:46 AM Rea ORN wrote: Red Word that prompted transfer to Nurse Triage: Increased mucus, left ear clogged, congestion Reason for Disposition  Earache  Answer Assessment - Initial Assessment Questions Patient reports nasal congestion and left ear pain that started yesterday. Pain is 6 out of 10 for sinus pressure. Patient reports hx of ear infections and sinus problems.   1. LOCATION: Where does it hurt?      Under eyes 2. ONSET: When did the sinus pain start?  (e.g., hours, days)      Started yesterday morning 3. SEVERITY: How bad is the pain?   (Scale 0-10; or none, mild, moderate or severe)     6 out of 10 4. RECURRENT SYMPTOM: Have you ever had sinus problems before? If Yes, ask: When was the last time? and What happened that time?      Yes-last time was earlier this ear 5. NASAL CONGESTION: Is the nose blocked? If Yes, ask: Can you open it or must you breathe through your mouth?     Yes-has to breathing out of mouth.  6. NASAL DISCHARGE: Do you have discharge from your nose? If so ask, What color?     Yes-clear 7. FEVER: Do you have a fever? If Yes, ask: What is it, how was it measured, and when did it start?      no 8. OTHER SYMPTOMS: Do you have any other symptoms? (e.g., sore throat, cough, earache, difficulty breathing)     Left ear pain-feels like it is clogged per patient,  9. PREGNANCY: Is there any chance you are  pregnant? When was your last menstrual period?     no  Protocols used: Sinus Pain or Congestion-A-AH

## 2024-07-30 ENCOUNTER — Telehealth: Payer: Self-pay

## 2024-07-30 NOTE — Telephone Encounter (Signed)
 Copied from CRM [id number]. Topic: Clinical - Medication Question >> Jul 30, 2024  2:51 PM Shereese L wrote: Reason for CRM: Patient was exposed to Flu A from her children and was adv by their pediatrician to get tamaflu from her pcp as a preventative for herself. Please send to [facility]

## 2024-08-01 ENCOUNTER — Other Ambulatory Visit (HOSPITAL_BASED_OUTPATIENT_CLINIC_OR_DEPARTMENT_OTHER): Payer: Self-pay

## 2024-08-01 ENCOUNTER — Telehealth: Admitting: Internal Medicine

## 2024-08-01 VITALS — Temp 100.1°F | Ht 64.0 in | Wt 130.0 lb

## 2024-08-01 DIAGNOSIS — J111 Influenza due to unidentified influenza virus with other respiratory manifestations: Secondary | ICD-10-CM | POA: Diagnosis not present

## 2024-08-01 MED ORDER — OSELTAMIVIR PHOSPHATE 75 MG PO CAPS
75.0000 mg | ORAL_CAPSULE | Freq: Two times a day (BID) | ORAL | 0 refills | Status: AC
  Filled 2024-08-01: qty 10, 5d supply, fill #0
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
## 2024-08-01 NOTE — Telephone Encounter (Signed)
 Seen today with flu sx and pos test
